# Patient Record
Sex: Male | Born: 1945 | Race: White | Hispanic: No | Smoking: Current every day smoker
Health system: Southern US, Community
[De-identification: ages and names within clinical notes are randomized; demographics above are authoritative.]

## PROBLEM LIST (undated history)

## (undated) ENCOUNTER — Emergency Department (HOSPITAL_COMMUNITY): Admission: EM | Payer: Medicare HMO | Source: Home / Self Care

## (undated) DIAGNOSIS — I739 Peripheral vascular disease, unspecified: Secondary | ICD-10-CM

## (undated) DIAGNOSIS — K449 Diaphragmatic hernia without obstruction or gangrene: Secondary | ICD-10-CM

## (undated) DIAGNOSIS — I10 Essential (primary) hypertension: Secondary | ICD-10-CM

## (undated) HISTORY — PX: FEMORAL-FEMORAL BYPASS GRAFT: SHX936

## (undated) HISTORY — DX: Peripheral vascular disease, unspecified: I73.9

## (undated) HISTORY — PX: FEMORAL BYPASS: SHX50

## (undated) HISTORY — PX: SHOULDER SURGERY: SHX246

## (undated) HISTORY — DX: Essential (primary) hypertension: I10

## (undated) HISTORY — PX: ABDOMINAL SURGERY: SHX537

---

## 2000-09-11 ENCOUNTER — Ambulatory Visit (HOSPITAL_COMMUNITY): Admission: RE | Admit: 2000-09-11 | Discharge: 2000-09-11 | Payer: Self-pay | Admitting: Endocrinology

## 2000-09-11 ENCOUNTER — Encounter: Payer: Self-pay | Admitting: Endocrinology

## 2001-04-03 ENCOUNTER — Encounter: Payer: Self-pay | Admitting: Endocrinology

## 2001-04-03 ENCOUNTER — Encounter: Admission: RE | Admit: 2001-04-03 | Discharge: 2001-04-03 | Payer: Self-pay | Admitting: Endocrinology

## 2001-07-16 ENCOUNTER — Ambulatory Visit (HOSPITAL_BASED_OUTPATIENT_CLINIC_OR_DEPARTMENT_OTHER): Admission: RE | Admit: 2001-07-16 | Discharge: 2001-07-16 | Payer: Self-pay | Admitting: *Deleted

## 2001-07-16 ENCOUNTER — Encounter (INDEPENDENT_AMBULATORY_CARE_PROVIDER_SITE_OTHER): Payer: Self-pay | Admitting: *Deleted

## 2001-07-29 ENCOUNTER — Encounter: Payer: Self-pay | Admitting: Orthopedic Surgery

## 2001-07-29 ENCOUNTER — Encounter: Admission: RE | Admit: 2001-07-29 | Discharge: 2001-07-29 | Payer: Self-pay | Admitting: Orthopedic Surgery

## 2002-06-11 ENCOUNTER — Inpatient Hospital Stay (HOSPITAL_COMMUNITY): Admission: EM | Admit: 2002-06-11 | Discharge: 2002-06-15 | Payer: Self-pay | Admitting: Emergency Medicine

## 2002-06-11 ENCOUNTER — Encounter: Payer: Self-pay | Admitting: Emergency Medicine

## 2002-06-13 ENCOUNTER — Encounter: Payer: Self-pay | Admitting: Internal Medicine

## 2002-06-15 ENCOUNTER — Encounter: Payer: Self-pay | Admitting: Cardiovascular Disease

## 2002-08-07 ENCOUNTER — Emergency Department (HOSPITAL_COMMUNITY): Admission: EM | Admit: 2002-08-07 | Discharge: 2002-08-08 | Payer: Self-pay | Admitting: Emergency Medicine

## 2002-08-07 ENCOUNTER — Encounter: Payer: Self-pay | Admitting: Emergency Medicine

## 2002-08-08 ENCOUNTER — Encounter: Payer: Self-pay | Admitting: Emergency Medicine

## 2003-10-09 ENCOUNTER — Encounter: Admission: RE | Admit: 2003-10-09 | Discharge: 2003-10-09 | Payer: Self-pay | Admitting: Endocrinology

## 2003-10-19 ENCOUNTER — Emergency Department (HOSPITAL_COMMUNITY): Admission: EM | Admit: 2003-10-19 | Discharge: 2003-10-20 | Payer: Self-pay | Admitting: Emergency Medicine

## 2003-11-13 ENCOUNTER — Inpatient Hospital Stay (HOSPITAL_COMMUNITY): Admission: EM | Admit: 2003-11-13 | Discharge: 2003-11-15 | Payer: Self-pay | Admitting: Internal Medicine

## 2003-11-18 ENCOUNTER — Ambulatory Visit (HOSPITAL_COMMUNITY): Admission: RE | Admit: 2003-11-18 | Discharge: 2003-11-18 | Payer: Self-pay | Admitting: Internal Medicine

## 2004-05-15 ENCOUNTER — Emergency Department (HOSPITAL_COMMUNITY): Admission: EM | Admit: 2004-05-15 | Discharge: 2004-05-15 | Payer: Self-pay | Admitting: Emergency Medicine

## 2004-06-03 ENCOUNTER — Ambulatory Visit (HOSPITAL_COMMUNITY): Admission: RE | Admit: 2004-06-03 | Discharge: 2004-06-03 | Payer: Self-pay | Admitting: *Deleted

## 2004-12-22 ENCOUNTER — Emergency Department (HOSPITAL_COMMUNITY): Admission: EM | Admit: 2004-12-22 | Discharge: 2004-12-22 | Payer: Self-pay | Admitting: Emergency Medicine

## 2005-01-13 ENCOUNTER — Ambulatory Visit (HOSPITAL_COMMUNITY): Admission: RE | Admit: 2005-01-13 | Discharge: 2005-01-13 | Payer: Self-pay | Admitting: *Deleted

## 2005-01-24 ENCOUNTER — Ambulatory Visit (HOSPITAL_COMMUNITY): Admission: RE | Admit: 2005-01-24 | Discharge: 2005-01-24 | Payer: Self-pay | Admitting: Cardiovascular Disease

## 2005-01-26 ENCOUNTER — Ambulatory Visit (HOSPITAL_COMMUNITY): Admission: RE | Admit: 2005-01-26 | Discharge: 2005-01-26 | Payer: Self-pay | Admitting: Cardiovascular Disease

## 2005-11-22 ENCOUNTER — Encounter: Admission: RE | Admit: 2005-11-22 | Discharge: 2005-11-22 | Payer: Self-pay | Admitting: Occupational Medicine

## 2006-03-16 ENCOUNTER — Ambulatory Visit (HOSPITAL_COMMUNITY): Admission: RE | Admit: 2006-03-16 | Discharge: 2006-03-16 | Payer: Self-pay | Admitting: *Deleted

## 2006-03-26 ENCOUNTER — Ambulatory Visit (HOSPITAL_COMMUNITY): Admission: RE | Admit: 2006-03-26 | Discharge: 2006-03-26 | Payer: Self-pay | Admitting: Cardiovascular Disease

## 2006-03-27 ENCOUNTER — Ambulatory Visit (HOSPITAL_COMMUNITY): Admission: RE | Admit: 2006-03-27 | Discharge: 2006-03-27 | Payer: Self-pay | Admitting: Cardiovascular Disease

## 2006-04-02 ENCOUNTER — Encounter (INDEPENDENT_AMBULATORY_CARE_PROVIDER_SITE_OTHER): Payer: Self-pay | Admitting: *Deleted

## 2006-04-02 ENCOUNTER — Inpatient Hospital Stay (HOSPITAL_COMMUNITY): Admission: RE | Admit: 2006-04-02 | Discharge: 2006-04-05 | Payer: Self-pay | Admitting: *Deleted

## 2006-04-26 ENCOUNTER — Ambulatory Visit: Payer: Self-pay | Admitting: *Deleted

## 2006-09-13 ENCOUNTER — Ambulatory Visit: Payer: Self-pay | Admitting: *Deleted

## 2006-10-11 ENCOUNTER — Ambulatory Visit: Payer: Self-pay | Admitting: *Deleted

## 2007-09-10 ENCOUNTER — Encounter: Admission: RE | Admit: 2007-09-10 | Discharge: 2007-09-10 | Payer: Self-pay | Admitting: Cardiovascular Disease

## 2007-10-07 ENCOUNTER — Ambulatory Visit: Payer: Self-pay | Admitting: Vascular Surgery

## 2008-01-25 ENCOUNTER — Observation Stay (HOSPITAL_COMMUNITY): Admission: EM | Admit: 2008-01-25 | Discharge: 2008-01-26 | Payer: Self-pay | Admitting: Emergency Medicine

## 2008-04-06 ENCOUNTER — Ambulatory Visit: Payer: Self-pay | Admitting: Vascular Surgery

## 2009-10-01 ENCOUNTER — Encounter: Admission: RE | Admit: 2009-10-01 | Discharge: 2009-10-01 | Payer: Self-pay | Admitting: Cardiovascular Disease

## 2009-10-14 ENCOUNTER — Ambulatory Visit: Payer: Self-pay | Admitting: Vascular Surgery

## 2010-08-02 NOTE — Procedures (Signed)
BYPASS GRAFT EVALUATION   INDICATION:  Followup of lower extremity bypass graft.  Patient has no  new complaints at this time.   HISTORY:  Diabetes:  No.  Cardiac:  No.  Hypertension:  No.  Smoking:  Yes, 1 pack per day.  Previous Surgery:  History of bilateral CIA, PTA and stenting.  Right-to-  left fem-fem BPG on 04/02/2006.   SINGLE LEVEL ARTERIAL EXAM                               RIGHT              LEFT  Brachial:                    138                132  Anterior tibial:             130                130  Posterior tibial:            138                130  Peroneal:  Ankle/brachial index:        1.0                0.94   PREVIOUS ABI:  Date: 04/26/2006  RIGHT:  >1.0  LEFT:  0.92   LOWER EXTREMITY BYPASS GRAFT DUPLEX EXAM:  See attached worksheet for  velocities.   DUPLEX:  Doppler arterial waveforms are triphasic to biphasic proximal  to, within and distal to the right-to-left fem-fem BPG.   IMPRESSION:  1. Stable ABIs bilaterally.  2. Patent right-to-left fem-fem BPG.     ___________________________________________  P. Liliane Bade, M.D.   PB/MEDQ  D:  10/07/2007  T:  10/07/2007  Job:  [id number]

## 2010-08-02 NOTE — Procedures (Signed)
BYPASS GRAFT EVALUATION   INDICATION:  Status post right-to-left femoral-femoral bypass graft.   HISTORY:  Diabetes:  No.  Cardiac:  No.  Hypertension:  No.  Smoking:  Yes.  Previous Surgery:  Right-to-left femoral-to-femoral bypass graft.   SINGLE LEVEL ARTERIAL EXAM                               RIGHT              LEFT  Brachial:                    127                136  Anterior tibial:             139                118  Posterior tibial:            130                116  Peroneal:  Ankle/brachial index:        >1                 0.87   PREVIOUS ABI:  Date: April 26, 2006  RIGHT:  >1  LEFT:  0.92   LOWER EXTREMITY BYPASS GRAFT DUPLEX EXAM:  See attached sheet for  velocities.   DUPLEX:  Evidence of triphasic arterial wave forms proximal to, within,  and distal to the right-to-left femoral-femoral bypass graft.   IMPRESSION:  1. Patent right-to-left femoral-femoral bypass graft.  2. Right ankle-brachial index appears within normal limits with      triphasic flow.  3. Left ankle-brachial index appears stable with mild arterial      compromise and triphasic flow.   ___________________________________________  P. Liliane Bade, M.D.   AS/MEDQ  D:  10/11/2006  T:  10/12/2006  Job:  [id number]

## 2010-08-02 NOTE — Procedures (Signed)
BYPASS GRAFT EVALUATION   INDICATION:  Follow up fem-fem bypass graft.   HISTORY:  Diabetes:  No.  Cardiac:  No.  Hypertension:  No.  Smoking:  Yes.  Previous Surgery:  History of bilateral common iliac artery stents,  right-to-left fem-fem bypass graft on 04/02/06.   SINGLE LEVEL ARTERIAL EXAM                               RIGHT              LEFT  Brachial:                    119                131  Anterior tibial:             122                120  Posterior tibial:            133                135  Peroneal:  Ankle/brachial index:        1.02               1.03   PREVIOUS ABI:  Date: 10/07/07  RIGHT:  1.0  LEFT:  0.94   LOWER EXTREMITY BYPASS GRAFT DUPLEX EXAM:   DUPLEX:  1. Triphasic Doppler waveforms noted throughout the bypass graft and      its native vessels with no focal narrowing noted.  2. Stable increased velocity of 273 cm/s noted at the proximal      anastomosis; however, this is most likely due to the angle of graft      take-off.   IMPRESSION:  1. Patent right-to-left femoral-femoral bypass graft with increased      velocity noted, as described above.  2. Stable bilateral ankle brachial indices.   ___________________________________________  P. Liliane Bade, M.D.   CH/MEDQ  D:  04/06/2008  T:  04/06/2008  Job:  [id number]

## 2010-08-02 NOTE — Assessment & Plan Note (Signed)
OFFICE VISIT   Jeremiah Hansen, Jeremiah Hansen  DOB:  Aug 02, 1945                                       09/13/2006  WUJWJ#:[id number]   The patient is status post right to left femoral-femoral bypass, carried  out April 02, 2006 at [facility].  He has had a couple of  episodes of superficial infection apparently at his incision sites.  Called the office earlier this week complaining of some redness around  his incision site with some drainage.  He was placed on Keflex at that  time.  On evaluation at this time he has some jock itch in his groin  area.  There is no evidence of graft infection.  The graft is patent.  Blood pressure is 124/78, pulse 71 per minute regular, respirations 18  per minute.  Temperature 98.   The patient instructed to complete his course of Keflex, cleanse his  groin area with antibacterial soap daily and dry well with a hair dryer.  Apply Nystatin powder and place on oral fluconazole for 10 days.  Return  p.r.n.   Balinda Quails, M.D.  Electronically Signed   PGH/MEDQ  D:  09/13/2006  T:  09/14/2006  Job:  [id number]

## 2010-08-05 NOTE — Op Note (Signed)
NAME:  Jeremiah Hansen, Jeremiah Hansen NO.:  [id number]   MEDICAL RECORD NO.:  [id number]          PATIENT TYPE:  AMB   LOCATION:  SDS                          FACILITY:  [facility]   PHYSICIAN:  Balinda Quails, M.D.    DATE OF BIRTH:  1945/09/23   DATE OF PROCEDURE:  01/13/2005  DATE OF DISCHARGE:                                 OPERATIVE REPORT   PHYSICIAN:  Denman George, MD.   DIAGNOSIS:  Recurrent left lower extremity claudication.   PROCEDURE:  1.  Abdominal aortogram with pelvic runoff arteriography.  2.  Left lower extremity arteriogram.  3.  Bilateral common iliac artery kissing balloon angioplasty .   ACCESS:  Bilateral common femoral artery 6-French sheath, right, 7-French  sheath, left.   CONTRAST:  125 mL of Visipaque.   COMPLICATIONS:  None apparent.   CLINICAL NOTE:  Jeremiah Hansen is a 65 year old male with history of  heavy tobacco use.  He has previously undergone bilateral common iliac  artery kissing balloon stent deployment due to bifurcation plaque disease  and bilateral claudication.  This was carried out approximately 6 months  ago.  He presented with recurrent claudication symptoms in his left lower  extremity.  Doppler evaluation was consistent with a recurrent left iliac  occlusive disease.  He is brought to the cath lab at this time for  diagnostic arteriography, and planned further intervention as indicated.  Risks and benefits of this procedure were explained to the patient in  detail.  Major morbidity and mortality 1% to 2%.   PROCEDURE NOTE:  The patient was brought to the cath lab in stable  condition.  Placed in supine position.  Both groins were prepped and draped  in a sterile fashion.  Administered 50 mcg of fentanyl intravenously.  Skin  and subcutaneous tissue in the left groin were instilled with 1% Xylocaine.  The needle was easily introduced into the left common femoral artery. A  0.035 J-wire passed through the needle into  the mid abdominal aorta.  The  needle removed and a 6-French sheath advanced over the guidewire.  The  dilator removed and the sheath flushed with heparin and saline solution.  A  standard pigtail catheter was then advanced up over the guidewire to the  juxta-renal aorta.   AP abdominal aortogram obtained.  This revealed single widely patent renal  arteries bilaterally.  Brisk flow was noted through the superior mesenteric  artery.  The infrarenal aorta was widely patent.  At the aortic bifurcation,  there was an eccentric plaque jutting into the left common iliac artery  origin. This resulted in approximately a 70% to 80% stenosis of the origin  of left common iliac artery.  Bilateral common iliac stents were in place in  a kissing configuration.  The right common iliac artery revealed mild in-  stent restenosis.  The remainder of the common iliac, external iliac and  hypogastric arteries were widely patent bilaterally.   Through the left femoral sheath, a retrograde injection was made to obtain  left lower extremity runoff arteriography.  This revealed the left external  iliac artery to be widely patent.  The left profunda femoris was normal.  The left superficial femoral artery revealed mild plaque disease without  significant stenosis.  The left popliteal artery was widely patent.  The  runoff to the left foot was intact with three-vessel tibial artery runoff.   An exchange was then made in the left groin for a long 7-French sheath.   The right groin was accessed with a 6-French sheath.  Skin and subcutaneous  tissue were instilled with 1% Xylocaine.  A needle was easily introduced in  the right common femoral artery.  A 0.035 J-wire passed through the needle  into the mid-abdominal aorta.   The patient was then administered 5000 units of heparin intravenously.  Initial ACT measured 168 seconds.  The patient received an additional 2000  units of heparin intravenously, repeat ACT  211 seconds.   A long 7-French sheath was then advanced over the guidewire across the left  common iliac stenosis.  A 2 x 2 cutting balloon was then advanced over the  guidewire.  This was inflated within the left common iliac stent with 4  separate inflations at 8 atmospheres x15 seconds with rotation of the  balloon.  The cutting balloon was then removed.  Bilateral 8 x 2 Powerflex  balloons were then advanced over the guidewires and placed within the  kissing stents and deployed at 8 atmospheres x20 seconds.  Readjusted and 2  further deployments at 10 atmospheres x30 seconds.  Following this, the  balloons were removed.  Retrograde injection then made through the left  femoral sheath.  This revealed excellent technical result.  There was brisk  flow noted through the left common iliac artery with minimal residual  stenosis.   The patient tolerated the procedure well.  There were no apparent  complications.  Repeat ACT was 200 seconds.  Total contrast -- 125 mL of  Visipaque.  The patient did receive an additional 50 mcg of fentanyl during  the procedure.   Bilateral sheaths were then removed.  There were no apparent complications.   FINAL IMPRESSION:  1.  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  2.  Successful redilatation of in-stent restenosis, left common iliac      artery, using kissing balloon bilateral common iliac technique.      Balinda Quails, M.D.  Electronically Signed     PGH/MEDQ  D:  01/13/2005  T:  01/13/2005  Job:  [id number]

## 2010-08-05 NOTE — Op Note (Signed)
NAME:  Jeremiah Hansen, Jeremiah Hansen NO.:  [id number]   MEDICAL RECORD NO.:  [id number]          PATIENT TYPE:  AMB   LOCATION:  SDS                          FACILITY:  [facility]   PHYSICIAN:  Balinda Quails, M.D.    DATE OF BIRTH:  Jan 16, 1946   DATE OF PROCEDURE:  03/16/2006  DATE OF DISCHARGE:                               OPERATIVE REPORT   DIAGNOSIS:  Left lower extremity claudication.   PROCEDURE:  Abdominal aortogram with bilateral lower extremity runoff  arteriography.   ACCESS:  Right common femoral artery 5-French sheath.   CONTRAST:  130 miles Visipaque.   COMPLICATIONS:  None apparent.   CLINICAL NOTE:  [name] is a 65 year old male with a history  of tobacco abuse.  He has previously undergone bilateral common iliac  stenting in March of 2006 followed by redo angioplasty for recurrent  stenosis October 2006.  He has developed recurrent symptoms of  claudication in his left lower extremity.  Doppler evaluation consistent  with left iliac occlusion.  Brought to the cath lab at this time for  diagnostic arteriography and possible reintervention.   PROCEDURE NOTE:  The patient brought to the cath lab in stable  condition.  Placed in the supine position.  Both groins prepped and  draped in a sterile fashion.  Administered 2 mg of Versed, 50 mcg of  Fentanyl intravenously.  Skin and subcutaneous tissue of right groin  instilled with 1% Xylocaine.  Access right common femoral artery  obtained with an 18 gauge needle.  A 0.035 J-wire passed through the  needle into the mid abdominal aorta.  A 5-French sheath advanced over  the guidewire.   A pigtail catheter advanced over the guidewire to the mid abdominal  aorta.  Standard AP mid abdominal aortogram obtained.  This revealed  widely patent single bilateral renal arteries.  The infrarenal aorta was  patent.  The right common iliac artery stent was widely patent.  The  left common iliac artery stent was  occluded at the origin of the left  common iliac artery.  The pelvic arteriogram revealed patency of the  right common iliac, external and hypogastric arteries.  On the left side  the proximal common iliac artery was occluded, there was collateral flow  reconstituting the hypogastric and left external iliac arteries.   Lower extremity runoff arteriography obtained.  This verified patency of  the common femoral, profunda femoris, superficial femoral, popliteal and  three-vessel tibial artery runoff bilaterally.   This completed the arteriogram procedure.  Guidewire reinserted.  Pigtail catheter removed.  Right femoral sheath removed.  No apparent  complications.   FINAL IMPRESSION:  1. Single widely patent bilateral renal arteries.  2. Occlusion of the left common iliac artery stent.  3. Widely patent right common iliac artery stent.  4. Intact infrainguinal runoff bilaterally.   DISPOSITION:  These results have been reviewed with the patient.  He  will require right-to-left femoral-femoral bypass for relief of  claudication symptoms.      Balinda Quails, M.D.  Electronically Signed     PGH/MEDQ  D:  03/16/2006  T:  03/16/2006  Job:  [id number]   cc:   Gregary Signs A. Everardo All, MD  Ricki Rodriguez, M.D.

## 2010-08-05 NOTE — Op Note (Signed)
NAME:  [name], [name] NO.:  [id number]   MEDICAL RECORD NO.:  [id number]          PATIENT TYPE:  INP   LOCATION:  3303                         FACILITY:  [facility]   PHYSICIAN:  Balinda Quails, M.D.    DATE OF BIRTH:  02/09/1946   DATE OF PROCEDURE:  04/02/2006  DATE OF DISCHARGE:                               OPERATIVE REPORT   SURGEON:  P Bud Face, MD   ASSISTANT:  Gershon Crane, PA.   ANESTHETIC:  General endotracheal.   ANESTHESIOLOGIST:  Dr. Katrinka Blazing.   PREOPERATIVE DIAGNOSIS:  Left lower extremity claudication.   POSTOPERATIVE DIAGNOSIS:  Left lower extremity claudication.   PROCEDURE:  Right-to-left femoral-femoral bypass with 7-mm Dacron  Hemashield graft.  Left lower extremity claudication.   CLINICAL NOTE:  Jeremiah Hansen is a 65 year old male with history of  known peripheral vascular disease.  He has continued to use tobacco.  He  previously underwent a bilateral common iliac artery kissing stent  procedure.  Initially following this, he had complete relief of  claudication.  He represented with left lower extremity claudication  symptoms.  Workup for this revealed occlusion of left common iliac stent  at its origin.  He is brought to the operating at this time for right-to-  left femoral-femoral bypass.   PROCEDURE NOTE:  The patient brought to the operating room stable  condition.  Placed in supine position.  General endotracheal anesthesia  induced.  Both legs prepped and draped in sterile fashion.   Matching longitudinal skin incisions were made from the inguinal  ligament bilaterally.  Dissection carried down through subcutaneous  tissue with electrocautery.  Lymphatics divided.  Bridging veins ligated  with 2-0 silk and divided.  The common femoral artery exposed at the  inguinal ligament bilaterally.  This was freed and encircled with vessel  loop.  Distal dissection carried down the origin of the profunda and  superficial femoral  arteries which were encircled with vessel loops  bilaterally.  Small tributaries were ligated with 3-0 silk.  Larger  tributaries controlled with looped 2-0 silk suture.   Evaluation of the right groin revealed an excellent pulse.  There was  moderate thickening of the common femoral artery.  Examination of the  left groin revealed absent palpable pulse.  The common femoral artery  was patent, moderately thickened.  The profunda and superficial femoral  artery at the origins were also widely patent.   A suprapubic tunnel was created between the two incisions and a 7 mm  Hemashield Dacron graft placed through the tunnel.   The patient administered 5000 units heparin intravenously.  Adequate  circulation time permitted.  Right femoral vessels controlled with  clamps.  Longitudinal arteriotomy made in the right common femoral  artery.  The right side of the graft was beveled and anastomosed end-to-  side to the right common femoral artery using running 5-0 Prolene  suture.  At completion of the femoral anastomosis the vessels were  flushed through the graft and the graft controlled with a fistula clamp.   In the left groin, the femoral vessels controlled with clamps.  Longitudinal  arteriotomy made.  This was a moderately diseased vessel,  common femoral artery patent.  The left side of the graft was beveled  and anastomosed end-to-side to common femoral artery using running 5-0  Prolene suture.  At completion of the graft the vessels were flushed.  Clamps were then removed.  Excellent flow present through the graft.  Adequate hemostasis obtained.  Sponge and instrument counts correct.   The wound was irrigated with saline solution.  Subcutaneous tissue  closed in two layers of running 2-0 Vicryl suture.  Skin closed with 4-0  Monocryl.  Steri-Strips applied.   Sterile dressing applied.  No apparent complications.  Transferred  recovery room in stable condition.      Balinda Quails,  M.D.  Electronically Signed     PGH/MEDQ  D:  04/02/2006  T:  04/03/2006  Job:  [id number]   cc:   Eduardo Osier. Sharyn Lull, M.D.

## 2010-08-05 NOTE — Discharge Summary (Signed)
NAME:  Jeremiah Hansen, Jeremiah Hansen NO.:  [id number]   MEDICAL RECORD NO.:  [id number]                   PATIENT TYPE:  INP   LOCATION:  0367                                 [facility]ACILITY:  [facility]   PHYSICIAN:  Titus Dubin. Alwyn Ren, M.D. [facility]         DATE OF BIRTH:  Dec 24, 1945   DATE OF ADMISSION:  11/13/2003  DATE OF DISCHARGE:  11/15/2003                                 DISCHARGE SUMMARY   ADMISSION DIAGNOSES:  1. Epigastric pain.  2. Thoracic pains.  3. Recent prostatitis.   DISCHARGE DIAGNOSES:  1. Epigastric pain.  2. Thoracic pain.  3. Recent prostatitis.  4. Abnormal CAT scan of the lungs with suggestion of mediastinal and hilar     lymphadenopathy with micronodular opacities in the upper lobes suggesting     infectious or inflammatory process.  Findings relatively stable since Aug 08, 2003.  5. Gallstones without evidence of acute cholecystitis.   HISTORY:  Mr. Jeremiah Hansen is a 65 year old white male with pleuritic pain in his  scapular areas for two days.  He also has had epigastric pain for two years  intermittently.   He had previously been evaluated by a gastroenterologist on [address];  he is unable to remember the name of the gastroenterologist.   He was admitted to observation with cardiac enzymes and telemetry  monitoring.  Cardiac enzymes were negative for myocardial injury.  EKG was  normal.  The CPK ranged from 68 to 78, with normal troponin-I of 0.02 or  less x3.  A D-dimer was also normal at 0.33.  White blood cell count was  mildly elevated at 10,700;  hematocrit was 41.4.  Differential was normal.  CMET was completely normal.  Specifically, liver function tests reveal an  AST of 21, ALT of 22.  Lipase was normal at 39.  Urine revealed trace  leukocytes with 0 to 2 white cells.  Urine culture is pending.   On November 14, 2003, he stated that the pleuritic pain was no longer present,  but he was continuing to have some epigastric and left  upper quadrant pain  which had been a problem for at least a year or more.  Gallstones had been  diagnosed over a year ago during an evaluation for abdominal pain.   He believes his colonoscopy was performed in 2002.  He states that a 10 mm  polyp was found and he was due for repeat study, but has not.   Because of the pleuritic chest pain and negative cardiac enzymes, CAT scan  was performed.  This showed relative stability compared to Aug 08, 2002, of  bulky mediastinal and hilar lymphadenopathy with micronodular opacities in  both upper lobes.  Gallstones again were noted without acute cholecystitis.   At the time of discharge, he has normal epigastric tenderness, but is not  having epigastric pain and the thoracic pain has resolved.   At the time of discharge,  an ACE level is pending because of the CT scan  findings.   Gastroenterologic consultation will be pursued as an outpatient to be  arranged by Dr. Everardo All to evaluate the persistent epigastric discomfort.  An additional question would be should an elective cholecystectomy be  performed because of the recurrent nature of the epigastric pain.   Pulmonary consultation would be recommended because of the hilar and  mediastinal lymphadenopathy and the reticular nodular changes on CAT scan.  He may be a candidate for transbronchial biopsy.  Another consideration  would be mediastinoscopy, although the findings are stable based on the x-  ray report.   The discharge summary was dictated in front of Mr. Dennard to facilitate  continuity of care and verify that he will follow up.  He states that he  would prefer to see one of the gastroenterologist at [facility] practice.  He  is concerned because of the chronicity of the problems and wishes to bring  some finality or closure to this clinical picture.   CONDITION ON DISCHARGE:  Improved.   PROGNOSIS:  Depended upon the evaluations of both gastroenterologic and  pulmonary as noted  above.   He will continue a proton pump inhibitor.  It will be recommended that he  avoid those triggers for hyperacidity and reflux such as the aspirin family,  alcohol, peppermint, tobacco, and caffeine.  Protonix  40 mg daily will be prescribed rather than the [facility] products until he is  evaluated by gastroenterology.  He has been taking aspirin.  It will be  recommended that he take a coated baby aspirin with breakfast or lunch.  He  will continue his statin.   He is to return to the emergency room should he have recurrence of the chest  or abdominal pain.   Additionally, he will continue his Septra DS he has been taking for his  prostatitis.   DIET:  As tolerated.   The specific restrictions in reference to the reflux and hyperacidity are  mentioned above.  He should avoid oral intake within two hours of going to  bed.  Again, pending at the time of discharge is an ACE level.                                               Titus Dubin. Alwyn Ren, M.D. [facility]    [facility]/MEDQ  D:  11/15/2003  T:  11/16/2003  Job:  [id number]   cc:   Gregary Signs A. Everardo All, M.D. [facility]

## 2010-08-05 NOTE — Discharge Summary (Signed)
NAME:  Jeremiah Hansen, Jeremiah Hansen NO.:  [id number]   MEDICAL RECORD NO.:  [id number]          PATIENT TYPE:  OIB   LOCATION:  2899                         FACILITY:  [facility]   PHYSICIAN:  Constance Holster, PA    DATE OF BIRTH:  [birth year]   DATE OF ADMISSION:  03/27/2006  DATE OF DISCHARGE:  03/27/2006                               DISCHARGE SUMMARY   DICTATED FOR:  Liliane Bade, M.D.   ADMISSION DIAGNOSES:  1. Left iliac occlusion disease.  2. Suspicious mole on back.   DISCHARGE DIAGNOSES:  1. Left iliac occlusive disease, status post right to left FFBG.  2. Suspicious mole, status post removal.  3. Hypertension.  4. Hyperlipidemia.  5. Gastroesophageal reflux disease.  6. Hiatal hernia.  7. Extracranial cerebrovascular occlusive disease.  8. Coronary artery disease.  9. History of tobacco abuse.   PROCEDURES:  April 02, 2006 - The patient had a right to left femoro-  femoral bypass graft with a 7-mm Hemashield and removal of mole with  biopsy.   HISTORY AND PHYSICAL:  This is a 65 year old Caucasian male, past  medical history of tobacco abuse and coronary artery disease.  The  patient has been complaining of left lower extremity claudication.  He  has previously undergone bilateral iliac angioplasty with stenting in  March 2006, followed by a redo angioplasty in October 2006.  The patient  did initially obtain significant relief from his claudication symptoms;  however, now he has recurrent symptoms in the left lower extremity for  approximately 2 months.  The patient's Doppler evaluation verifies a  significant drop off in his left lower extremity, ABI at 0.55.  The  right ABI remains greater than 1.  His previous left ABI following his  most recent intervention was greater than 1.  He did have a PTA and  stent bilaterally iliacs in March and October of 2006.  Just thought the  patient undergo a right to left fem-fem bypass graft for relief of his  claudication  symptoms.  The patient agreed to proceed.  See dictated  History and Physical for further details.   HOSPITAL COURSE:  The patient underwent a fem-fem bypass graft and  removal of a mole on his back April 02, 2006, without any  complications.  Postoperatively the patient was a bit slower to  ambulate.  He did not ambulate on postop day #1, and he was encouraged  to ambulate on postop day #2.  Postop day #3, the patient was feeling  better and ambulating without difficulty.  The patient did have a PCA  postoperatively for pain control.  This was discontinued on postop day  #3, and he switched to p.o. pain medication.  The patient had some  leukocytosis postoperatively with a white blood cell count of 16.4 on  postop day #1; however, did not have any signs or symptoms of infection  and this trended downward.  Postop day #3, white blood cell count is  12.6.   Postoperatively, the patient has been hemodynamically stable.  Hematocrit 13.1, hemoglobin 38.7.  Renal function has been intact with  BUN  of 4, creatinine of 0.8.  The patient has 2+ bilateral dorsalis  pedis pulses in his extremities.  His incisions are clear, dry and  intact and healing well.  The patient did have some nausea postop day  #1.  He was given Zofran with relief.  The patient is now tolerating his  diet well.  The patient had ABIs on April 04, 2006, which are 1.03 on  the right and 0.97 on the left, has since improved bilaterally.   DISCHARGE DISPOSITION:  The patient will be discharged home in good  health in the next 1-2 days provided he is ambulating without  difficulty, and his pain is controlled on p.o. medication.   MEDICATIONS:  1. Percocet 5/325 mg 1-2 tabs p.o. every 4 hours p.r.n.  2. Norvasc 2.5 mg p.o. daily.  3. Plavix 75 mg p.o. daily.  4. Lipitor 80 mg p.o. daily.  5. Aspirin 81 mg p.o. daily.  6. Protonix 40 mg p.o. daily.   INSTRUCTIONS:  The patient instructed to follow a low-fat, low-salt   diet.  No driving or heavy lifting greater than 10 pounds.  He is  encouraged to ambulate 3-4 times daily and increase activity as  tolerated.  He is to continue his breathing exercises.  He may shower  and clean his incisions with mild soap and water.  He is to call our  office if any one problem should arise such as incision erythema,  drainage, temp greater than 101.5.   FOLLOWUP:  The patient will have follow up appointment with Dr. Madilyn Fireman on  April 26, 2006, at 11:30 a.m.      Constance Holster, PA     JMW/MEDQ  D:  04/05/2006  T:  04/05/2006  Job:  [id number]

## 2010-08-05 NOTE — H&P (Signed)
NAME:  Jeremiah Hansen, Jeremiah Hansen NO.:  [id number]   MEDICAL RECORD NO.:  [id number]           PATIENT TYPE:   LOCATION:                               FACILITY:  [facility]   PHYSICIAN:  Balinda Quails, M.D.    DATE OF BIRTH:  11-15-1945   DATE OF ADMISSION:  04/02/2006  DATE OF DISCHARGE:                              HISTORY & PHYSICAL   CHIEF COMPLAINT:  Left lower extremity claudication.   HISTORY OF PRESENT ILLNESS:  Patient is a 65 year old white male with  recurrent left lower extremity increasing claudication symptoms.  He has  known iliac artery disease, status post previous bilateral iliac artery  stenting and PTA in March and October of 2006.  Most recently, he has  developed recurrent symptoms of claudication of his left lower extremity  and Doppler study reveals findings consistent with left iliac occlusion.  Ankle brachial index was 0.55 on the left and greater than 1.0 on the  right.  Arteriogram was performed on March 17, 2006 by Dr. Madilyn Fireman and  the impression included the following:  1.  Single widely patent  bilateral renal arteries.  2.  Occlusion of left common iliac artery  stent.  3.  Widely patent right common iliac artery stent.  4.  Intact  infrainguinal run off bilaterally.  Currently, the patient primarily has  symptoms in the hip and thigh with some occasional rest pain, but  primarily this is related to ambulation.  He does get some swelling in  his lower extremity and the left foot is also cool.  He has had no  significant difficulties with motor function and he denies skin changes  to include ulcerations or evidence of infection.   PAST MEDICAL HISTORY:  1. Peripheral vascular occlusive disease.  2. Hypercholesterolemia.  3. Gastroesophageal reflux/hiatal hernia.  4. History of cranial cerebrovascular occlusive disease.  5. Coronary artery disease.  6. Tobacco abuse.   PAST SURGICAL HISTORY:  Bilateral iliac artery stenting and PTA.   ALLERGIES:  INCLUDE AN INTOLERANCE TO CODEINE, WHICH CAUSES NAUSEA.   CURRENT MEDICATIONS:  1. Protonix 40 mg daily, which he is not taking currently due to cost.  2. Plavix 75 mg daily.  3. Aspirin 162 mg daily.  4. Lipitor 80 mg daily.  5. Norvasc 2.5 mg daily, which was started last Wednesday.   REVIEW OF SYSTEMS:  See the history of the present illness for pertinent  positives and negatives; otherwise, is remarkable for dyspnea on  exertion.   SOCIAL HISTORY:  He is single with 2 children.  He has smoked for  approximately 4 years, 1-2 packs per day.  He has tried to quit in the  past 2 weeks and has decreased significantly, but does smoke  occasionally.  He is anxious to quit completely.  Alcohol use, none.   FAMILY HISTORY:  His mother is deceased at age 55 from cancer and  history of myocardial infarction.  Father deceased at age 26 from heart  disease.  He has 1 sister with diabetes and 1 brother who had a  myocardial infarction.   PHYSICAL EXAMINATION:  VITAL SIGNS:  Blood pressure 130/74.  Heart rate  72.  Respirations 12.  GENERAL APPEARANCE:  [age]-year-old white male in no acute  distress.  HEENT EXAMINATION:  Normocephalic, atraumatic.  Pupils equal, round and  reactive to light.  Extraocular movements intact.  Oral mucosa is pink.  Teeth are in good repair.  Pharynx is clear of exudates or erythema.  Sclerae is anicteric.  NECK EXAMINATION:  Supple.  No jugular venous distention.  No bruits.  No  lymphadenopathy.  PULMONARY EXAMINATION:  Symmetrical on aspiration.  Nonlabored.  Clear  breath sounds without wheezes, rhonchi or crackle.  CARDIAC EXAMINATION:  Regular rate and rhythm.  No murmurs, gallops or  rubs.  ABDOMINAL EXAMINATION:  Soft, nontender, nondistended.  Normoactive  bowel sounds.  No masses.  No bruits.  GENITOURINARY/RECTAL:  Deferred.  EXTREMITY EXAMINATION:  Peripheral pulses absent.  Dorsalis pedis and  posterior tibial on the left,  present on right.  SKIN EXAMINATION:  He does have a small mole on the right lower portion  of his back.  It has an irregular border and is known to have increase  in appearance becoming more dark lately, noted by his girlfriend.  NEUROLOGICAL EXAMINATION:  Nonfocal.  Alert and oriented x3.  Muscle  strength is 5+ and equal bilaterally.  Findings are symmetric.   ASSESSMENT:  Severe iliac artery occlusive disease, left side.   PLAN:  Right to left femoral femoral bypass.  Also plans for biopsy of  the mole on the right lower portion of the back.      Rowe Clack, P.A.-C.      Balinda Quails, M.D.  Electronically Signed    WEG/MEDQ  D:  03/29/2006  T:  03/30/2006  Job:  [id number]   cc:   Gregary Signs A. Everardo All, MD  Ricki Rodriguez, M.D.

## 2010-08-05 NOTE — Discharge Summary (Signed)
NAME:  Jeremiah Hansen, Jeremiah Hansen NO.:  [id number]   MEDICAL RECORD NO.:  [id number]                   PATIENT TYPE:  INP   LOCATION:  3732                                 FACILITY:  [facility]   PHYSICIAN:  Titus Dubin. Alwyn Ren, M.D. [facility]         DATE OF BIRTH:  March 18, 1946   DATE OF ADMISSION:  06/11/2002  DATE OF DISCHARGE:  06/15/2002                                 DISCHARGE SUMMARY   ADMISSION DIAGNOSES:  1. Unstable angina pectoris, rule out myocardial infarction.  2. 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  3. History of right brain stroke.  4. Continuing tobacco use.  5. Hypercholesterolemia.   DISCHARGE DIAGNOSES:  1. Chest pain, questionable etiology.  2. 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  3. History of right brain stroke.  4. Continuing tobacco use.  5. Hypercholesterolemia.  6. Abnormal CT scan of lung with questionable hiatal lymphadenopathy, 9 mm     nodule in left lower lobe with chronic bronchitic changes.   BRIEF HISTORY:  The patient is a 65 year old gentleman who developed severe  midsternal chest pain the day of admission radiating to the neck, shoulder,  and down the left arm. It was aggravated by deep inspiration.  He also had  some dyspepsia-associated symptoms.  Sublingual nitroglycerin promptly  relieved the chest pain.   Subsequently he made the comment that his green arm band was suspiciously  changed to a white one after his date of birth was asked by the nurse.  He  questioned whether he had received medications which could have aggravated  the chest pain as he was unable to complete the admission portion of the  stress test.   Significantly, he states he has worked in [employer] all over the world and  in the [place] for over 20 years at the [employer].  Additionally, he  has smoked from age 44 to 79, quitting two weeks ago.  He states that he had  normal CT scan of  the lung in January 2003 at [facility].  I made an  attempt by phone to contact [facility] on March 27 but was unable to  receive contact with them but received a recorded advertising message.  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  I gave an  option of having a pulmonologist see him while he was here in the hospital,  but he declined to do that.  He says he plans to leave is his stress  Cardiolite is normal and see Dr. Everardo All March 29 in the office.  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   His serial CPKs were normal as were troponin's.  Basic metabolic profile was  normal.  White count was initially elevated at 13,900.  Lipid profile was  surprisingly good with a total cholesterol of 164, triglycerides 101, HDL  42, and LDL 102.  He exhibited no anemia.  He was anticoagulated with  heparin.  Glucose was initially elevated at 139, but on repeat was 109.  He  actually had five different CPKs and MBs which were within normal limits.   His basic chest x-ray showed slightly increased chronic markings but no  active disease.  As noted, he had no abnormalities on CT scan.   EKG was read as normal sinus rhythm with normal findings.   On the day of potential discharge, he was pain free.   He had exhibited intermittent frustration and anger over various matters.  He was transferred from 5500 to 3700 because of disturbances by his  roommate.  When I interviewed him and examined him on March 27, he was very  upset that I did not have the office records concerning previous CT scan.  He was placed on incentive spirometry because of questionable atelectasis.  There was the question of pneumonia as part of the differential on the CT  scan, but he was afebrile, and his white count had dropped to normal at  10,000.   At time of discharge, there will be no change in Mevacor or  aspirin.  He did  make comment that the Vicodin relieved his chest pain in the hospital.   Further cardiac workup to include possible catheterization will be up to  discussion with Dr. Algie Coffer.   As he refused pulmonary consultation in the hospital, I recommended that he  obtain the old CT scans for direct comparison with the new with further  evaluation based on this comparison.  He was concerned that the 9 mm nodule  might be a cancer.  I told him his options would be resection versus  radiographic monitoring.  Certainly if this lesion was not on the January  2003 film, then resection would be appropriate.  I told him that a 9 mm  nodule was not causing his chest pain.  He did have some hilar mediastinal  lymphadenopathy on CT scan.  Based on further evaluation, mediastinoscopy  might be indicated.   At this time, he was afebrile with blood pressure 110/70.  Pulse was 58 and  regular, respiratory rate 20 to 22 and unlabored.  Chest was clear.  I could  not appreciate lymphadenopathy.   In view of peripheral vascular occlusive disease and prior right brain  stroke, he must be considered as having cardiovascular disease.  Smoking  cessation will be critical.  At the time of admission, Dr. Amador Cunas  states the patient was smoking one pack per day.  But, as noted, he told me  he quit two weeks prior to admission.   The patient does understand that the radiographic changes will need followup  and that he has refused this a this time.   A copy of this will be sent to Dr Everardo All and Dr. Algie Coffer to facilitate  continuity care.                                               Titus Dubin. Alwyn Ren, M.D. [facility]    WFH/MEDQ  D:  06/15/2002  T:  06/16/2002  Job:  [id number]   cc:   Gregary Signs A. Everardo All, M.D. [facility]   Ricki Rodriguez, M.D.  [address]  [address]  [place] [address]

## 2010-08-05 NOTE — Op Note (Signed)
NAME:  Jeremiah Hansen, Jeremiah Hansen NO.:  [id number]   MEDICAL RECORD NO.:  [id number]          PATIENT TYPE:  OIB   LOCATION:  2899                         FACILITY:  [facility]   PHYSICIAN:  Balinda Quails, M.D.    DATE OF BIRTH:  December 14, 1945   DATE OF PROCEDURE:  06/03/2004  DATE OF DISCHARGE:                                 OPERATIVE REPORT   PHYSICIAN:  P Bud Face, MD   DIAGNOSIS:  Right lower extremity claudication.   PROCEDURE:  1.  Abdominal aortogram with bilateral lower extremity runoff arteriography.  2.  Bilateral common iliac artery percutaneous transluminal angioplasty and      stenting.  3.  AngioSeal closure of bilateral common femoral arteries.   ACCESS:  Bilateral common femoral artery 7-French sheaths.   CONTRAST:  240 mL of Visipaque.   COMPLICATIONS:  None.   CLINICAL NOTE:  Mr. Morad Tal is a 65 year old male with history of  fairly heavy tobacco abuse.  He was seen in the office with right lower  extremity claudication symptoms.  Physical examination and Doppler  evaluation reveals evidence of aortoiliac occlusive disease.  He is brought  to the operating at this time for planned diagnostic arteriography and  possible intervention.  Risks of the procedure were explained to the patient  in detail with a major morbidity and mortality of 1% to 2% to include, but  not limited to, vessel occlusion, bleeding and emergency surgery.   PROCEDURE NOTE:  The patient was brought to the cath lab in stable  condition.  Placed in supine position.  Both groins were prepped and draped  in sterile fashion.  Administered 50 mcg of fentanyl intravenously, 2 mg of  Versed intravenously.   Skin and subcutaneous tissue in the right groin was instilled with 1%  Xylocaine.  A needle was easily introduced into the right common femoral  artery.  A 0.025 Magic Torque guidewire was advanced through the needle in  the mid abdominal aorta.  The needle removed, 5-French  sheath advanced over  the guidewire, the dilator remove and the sheath flushed with heparin and  saline solution.   The pigtail catheter was then advanced over the guidewire.  Pigtail catheter  advanced through the mid abdominal aorta.  Standard AP mid abdominal  aortogram obtained.  This revealed widely patent bilateral single renal  arteries.  Brisk flow noted through the superior mesenteric artery.  The  infrarenal aorta was normal in caliber.  At the aortic bifurcation, there  was a complex plaque.  This plaque involve the origin of the common iliac  arteries bilaterally.  There was a high-grade 80% stenosis of the right  common iliac artery just beyond its origin.  There was a complex left  lateral wall aortic plaque impinging on the origin left common iliac artery  with approximately a 50% stenosis.   The pigtail catheter was brought down the aortic bifurcation and bilateral  lower extremity runoff arteriography obtained.  The common iliac arteries  were patent beyond their origin to the level of the origin of the external  iliacs.  The external  iliacs were patent bilaterally.  The common femoral  arteries were widely patent.  The profunda femoris and superficial femoral  arteries were intact bilaterally.  The popliteal arteries were normal  bilaterally.  There was intact three-vessel tibial artery runoff present  bilaterally in the calves.   Bilateral oblique pelvic arteriography was obtained.  This verified a large  left hypogastric artery.  The right hypogastric artery revealed a stenosis  at its origin.  The common iliac arteries bilaterally were normal in caliber  beyond their origin.  There were bilateral complex common iliac artery  origin plaques present with  80% and 50% stenoses in the right and left,  respectively.  The external oblique arteries were patent bilaterally.  The  largest hypogastric artery was on the left and provided the dominant flow to  the pelvis.    Skin and subcutaneous tissue in the left groin was then instilled with 1%  Xylocaine.  The needle easily introduced into the left common femoral  artery. A 0.025 Magic Torque guidewire was advanced through the needle into  the mid abdominal aorta.  A long 7-French sheath was then advanced over the  guidewire. The 7-French sheath was advanced up into the aorta.  The dilator  removed and sheath flushed with heparin and saline solution.  The guidewire  was then reinserted through the pigtail catheter in the right groin, the  pigtail catheter removed.  The6-French sheath was removed and exchanged for  a long 7-French sheath, which was advanced up into the abdominal aorta from  the right common femoral artery.  The dilator removed.   Bilateral hand injection was used to delineate the bifurcation to the  sheath.  Once this was clearly delineated, 8 x 29 Genesis stents were  advanced on the OPTA balloons across the aortic bifurcation with a kissing-  balloon technique.  These were inflated at 8 atmospheres x30 seconds.  The  balloons were then deflated and removed.  Hand injection made through the  right femoral sheath verified good position of the stents with minimal  residual stenosis.  The pigtail catheter was then reinserted and a pelvic  arteriogram obtained.  This revealed excellent technical result.  Minimal  residual stenosis noted at the complex plaque at the left side of the common  iliac artery origin.  There were no significant flow-limiting lesions or  dissections present.   Bilateral oblique retrograde femoral arteriograms were obtained to assure  that the sheaths were in the common femoral arteries bilaterally.  The  common femoral arteries were then closed bilaterally with Angio-Seal.  There  were no apparent complications.   The patient was transferred to the holding area in stable condition.   FINAL IMPRESSION: 1.  Single widely patent bilateral renal arteries.  2.  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  3.  Successful angioplasty and stenting of common iliac arteries bilaterally      without apparent complication.  4.  AngioSeal closure of bilateral common femoral arteries.   DISPOSITION:  These results been reviewed with the patient and family.  The  patient will be followed up in the office in approximately 6 weeks. Kept off  work for 1 week.      PGH/MEDQ  D:  06/03/2004  T:  06/03/2004  Job:  [id number]   cc:   Gita Kudo, M.D.  [address]  [place]  [place] [address]   Gregary Signs A.  Everardo All, M.D. [facility]   [facility]

## 2010-08-05 NOTE — Cardiovascular Report (Signed)
NAME:  Jeremiah Hansen, Jeremiah Hansen NO.:  [id number]   MEDICAL RECORD NO.:  [id number]          PATIENT TYPE:  OIB   LOCATION:  2899                         FACILITY:  [facility]   PHYSICIAN:  Ricki Rodriguez, M.D.  DATE OF BIRTH:  1945/10/03   DATE OF PROCEDURE:  03/27/2006  DATE OF DISCHARGE:                            CARDIAC CATHETERIZATION   Procedure done as an outpatient.   Left heart catheterization, selective coronary angiography, left  ventricular function study.   INDICATIONS:  This 65 year old white male with known coronary artery  disease had chest pain with some atypical features and exertional  dyspnea on along with history of hyperlipidemia and recently quitting  smoking.   APPROACH:  Right femoral artery using 4-French sheath and catheters.   COMPLICATIONS:  None.   Fifty-five mL of dye was used.   HEMODYNAMIC DATA:  The left ventricular pressure was 127/14 and aortic  pressure was 127/64.   CORONARY ANATOMY:  The left main coronary artery was unremarkable but  short.   Left anterior descending coronary artery.  The left anterior descending  coronary artery had luminal irregularities, and diagonal vessel had 20-  30% ostial lesion.  Otherwise were unremarkable   Left circumflex coronary artery.  The left circumflex coronary artery  was unremarkable; however, its obtuse marginal branch had ostial 60%  stenosis with good flow and minimal improvement from previous study of 2  years ago.  It was a dominant vessel with posterolateral and posterior  descending coronary arteries unremarkable.   Right coronary artery.  The right coronary artery was nondominant and  had a mid vessel 40% long lesion.  This was somewhat improved from 2  years ago.   Left ventriculogram.  The left ventriculogram showed preserved left  ventricular systolic function with ejection fraction of 55%.   IMPRESSION:  1. Moderate obtuse marginal branch of the left circumflex coronary      artery disease.  2. Mild right coronary artery disease.  3. Preserved left ventricular systolic function.   RECOMMENDATIONS:  This patient will continue his medical therapy along  with smoking cessation and Lipitor 80 mg daily.  Will add a small dose  of Norvasc or nitroglycerin long-acting for additional benefit.      Ricki Rodriguez, M.D.  Electronically Signed     ASK/MEDQ  D:  03/27/2006  T:  03/27/2006  Job:  [id number]

## 2010-08-05 NOTE — H&P (Signed)
NAME:  Jeremiah Hansen, Jeremiah Hansen NO.:  [id number]   MEDICAL RECORD NO.:  [id number]                   PATIENT TYPE:  INP   LOCATION:  2931                                 FACILITY:  [facility]   PHYSICIAN:  Gordy Savers, M.D. [facility]      DATE OF BIRTH:  [birth year]   DATE OF ADMISSION:  06/11/2002  DATE OF DISCHARGE:                                HISTORY & PHYSICAL   CHIEF COMPLAINT:  Chest pain.   HISTORY OF PRESENT ILLNESS:  The patient is a 65 year old gentleman without  known prior cardiac disease. He was stable until the day of admission when  he developed severe mid sternal chest pain.  This radiated to the neck,  shoulder, and down the left arm.  It was described as a severe aching pain  and did seem to be aggravated by deep inspiration.  He also described some  mild dyspepsia-type symptoms.  Because of his persistent pain, he presented  to the emergency room for evaluation where pain was promptly relieved with  sublingual nitroglycerin.  He is now admitted for further evaluation and  treatment, suspecting unstable angina and to rule out an acute MI.   PAST MEDICAL HISTORY:  1. Cardiovascular disease.  2. Hypercholesterolemia.  3. Ongoing tobacco use.   He has no known prior history of exertional chest pain but for the past year  has had exertional right hip discomfort with normal radiographs.  Past  Medical History is otherwise fairly unremarkable.  He had an extensive  outpatient evaluation in January 2003 for left leg weakness and dyspraxia.  He states this evaluation was negative.  He was disabled for five months due  to this deficit, however. This has subsequently largely cleared  neurologically.  He has had one remote admission for gastroenteritis.   PAST SURGICAL HISTORY:  None.   ALLERGIES:  Denies any medical allergies.   CURRENT MEDICATIONS:  Mevacor, unclear dose, and aspirin daily.   REVIEW OF SYSTEMS:  As mentioned, positive for a  one-year history of right  hip claudication.   FAMILY HISTORY:  Father died at 76 of a massive MI with history of diabetes.  Mother died at 36 of complications of cancer unclear type. She also had  coronary artery disease.  Two brothers and three sisters, one brother with  history of prostate cancer, two sisters with diabetes.   SOCIAL HISTORY:  He is a one-pack-per-day smoker. Works as a Curator.  He  is accompanied by wife and adult sons.   PHYSICAL EXAMINATION:  VITAL SIGNS:  Blood pressure 114/72.  SKIN:  Unremarkable without rash or diaphoresis.  HEENT:  Normal pupillary responses.  Conjunctivae clear.  ENT negative.  NECK:  No bruits or neck vein distention.  No adenopathy.  CHEST:  Clear anterolaterally.  CARDIOVASCULAR:  S1 and S2 normal.  No murmurs or gallops.  ABDOMEN:  Soft and nontender.  No organomegaly.  The patient did have  bilateral femoral  bruits and a diminished right femoral pulse.  EXTREMITIES:  No edema.  Peripheral pulses were full on the left.  The right  posterior tibial pulse was not easily palpable, and the right dorsalis pedis  pulse was diminished compared to the left.  NEUROLOGIC:  Nonfocal.   IMPRESSION:  1. Unstable angina pectoris, rule out acute myocardial infarction.  2. 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  3. History of prior right brain stroke.  4. Ongoing tobacco use.  5. Hypercholesterolemia.   DISPOSITION:  The patient will be admitted to hospital to the CCU.  Serial  cardiac enzymes and EKGs will be reviewed.  He will be placed on IV  nitrates, heparin, and will treat with beta blocker therapy as well as  antiplatelet agents.  The patient will be seen in consultation by  cardiology.                                               Gordy Savers, M.D. [facility]    PFK/MEDQ  D:  06/11/2002  T:  06/12/2002  Job:  [id number]

## 2010-08-05 NOTE — Cardiovascular Report (Signed)
NAME:  MARCIA, LEPERA NO.:  [id number]   MEDICAL RECORD NO.:  [id number]           PATIENT TYPE:   LOCATION:                                 FACILITY:   PHYSICIAN:  Ricki Rodriguez, M.D.       DATE OF BIRTH:   DATE OF PROCEDURE:  01/26/2005  DATE OF DISCHARGE:                              CARDIAC CATHETERIZATION   PROCEDURE:  Left heart catheterization, selective coronary angiography, left  venous systolic function study.   INDICATIONS:  This 65 year old white male had exertional chest pain with a  known history of peripheral vascular disease, smoking, and elevated  cholesterol level.   APPROACH:  Right femoral artery using 4-French sheath.   COMPLICATIONS:  None.   HEMODYNAMIC DATA:  The left ventricular pressure was 126/8 and aortic  pressure was 120/74.   LEFT VENTRICULOGRAM:  The left ventriculogram showed normal left venous  systolic function with ejection fraction of 65%.   CORONARY ANATOMY:  The left main coronary artery was short and unremarkable.   LEFT ANTERIOR DESCENDING CORONARY ARTERY:  The left anterior descending  coronary artery had 10-20% proximal disease with 30% eccentric near diagonal  vessel origin and the vessel was wrapped arround the apex of the heart.  The  diagonal 1 had a concentric ostial 30% stenosis and diagonal 2 was  unremarkable.   LEFT CIRCUMFLEX CORONARY ARTERY:  The left circumflex coronary artery had  ostial obtuse marginal branch 60-70% stenosis, otherwise it had only luminal  irregularities.  It was a dominant vessel and the posterior descending  coronary artery was small and unremarkable.   RIGHT CORONARY ARTERY:  The right coronary artery was a smaller vessel with  an eccentric 70% proximal lesion.  The rest of the vessel was unremarkable.   IMPRESSION:  1.  Multivessel coronary artery disease.  2.  Preserved left ventricular systolic function.   RECOMMENDATIONS:  This patient will have aggressive medical  therapy, and if  it fails then he may undergo angioplasty of the right coronary artery and  obtuse marginal branch.      Ricki Rodriguez, M.D.  Electronically Signed     ASK/MEDQ  D:  01/26/2005  T:  01/26/2005  Job:  [id number]

## 2010-08-23 ENCOUNTER — Other Ambulatory Visit (HOSPITAL_COMMUNITY): Payer: Self-pay | Admitting: Cardiovascular Disease

## 2010-09-02 ENCOUNTER — Ambulatory Visit (HOSPITAL_COMMUNITY): Payer: Self-pay

## 2010-09-02 ENCOUNTER — Encounter (HOSPITAL_COMMUNITY)
Admission: RE | Admit: 2010-09-02 | Discharge: 2010-09-02 | Disposition: A | Discharge status: Home / Self Care | Payer: Medicare HMO | Source: Ambulatory Visit | Attending: Cardiovascular Disease | Admitting: Cardiovascular Disease

## 2010-09-02 DIAGNOSIS — R079 Chest pain, unspecified: Secondary | ICD-10-CM | POA: Insufficient documentation

## 2010-09-02 MED ORDER — TECHNETIUM TC 99M TETROFOSMIN IV KIT
10.0000 | PACK | Freq: Once | INTRAVENOUS | Status: AC | PRN
  Administered 2010-09-02: 10 via INTRAVENOUS

## 2010-09-02 MED ORDER — TECHNETIUM TC 99M TETROFOSMIN IV KIT
30.0000 | PACK | Freq: Once | INTRAVENOUS | Status: AC | PRN
  Administered 2010-09-02: 30 via INTRAVENOUS

## 2010-12-20 LAB — PROTIME-INR: Prothrombin Time: 14.6

## 2010-12-20 LAB — CBC
HCT: 41.9
Hemoglobin: 14
MCHC: 33.2
MCV: 93.9
MCV: 94.9
Platelets: 327
RBC: 4.44
RBC: 4.47
RDW: 12.5
RDW: 13

## 2010-12-20 LAB — COMPREHENSIVE METABOLIC PANEL
AST: 24
Albumin: 3.5
CO2: 27
Chloride: 103
GFR calc Af Amer: >60
Glucose, Bld: 119 — ABNORMAL HIGH
Sodium: 135
Total Bilirubin: 0.7

## 2010-12-20 LAB — POCT I-STAT, CHEM 8
BUN: 8
Calcium, Ion: 1.16
Glucose, Bld: 108 — ABNORMAL HIGH
HCT: 43
Sodium: 139
TCO2: 30

## 2010-12-20 LAB — DIFFERENTIAL
Basophils Absolute: 0
Lymphocytes Relative: 48 — ABNORMAL HIGH
Lymphs Abs: 3.1
Monocytes Absolute: 0.5
Neutro Abs: 2.7

## 2010-12-20 LAB — CARDIAC PANEL(CRET KIN+CKTOT+MB+TROPI)
Relative Index: 1.4
Troponin I: 0.01
Troponin I: 0.01

## 2010-12-20 LAB — MAGNESIUM: Magnesium: 2

## 2010-12-20 LAB — LIPID PANEL: HDL: 24 — ABNORMAL LOW

## 2012-01-29 ENCOUNTER — Encounter: Payer: Self-pay | Admitting: Vascular Surgery

## 2013-01-23 ENCOUNTER — Other Ambulatory Visit: Payer: Self-pay | Admitting: *Deleted

## 2013-01-23 DIAGNOSIS — Z48812 Encounter for surgical aftercare following surgery on the circulatory system: Secondary | ICD-10-CM

## 2013-01-23 DIAGNOSIS — I739 Peripheral vascular disease, unspecified: Secondary | ICD-10-CM

## 2013-02-04 ENCOUNTER — Encounter: Payer: Self-pay | Admitting: Family

## 2013-02-05 ENCOUNTER — Ambulatory Visit (INDEPENDENT_AMBULATORY_CARE_PROVIDER_SITE_OTHER)
Admission: RE | Admit: 2013-02-05 | Discharge: 2013-02-05 | Disposition: A | Discharge status: Home / Self Care | Payer: Managed Care, Other (non HMO) | Source: Ambulatory Visit | Attending: Family | Admitting: Family

## 2013-02-05 ENCOUNTER — Ambulatory Visit: Payer: Medicare HMO | Admitting: Family

## 2013-02-05 ENCOUNTER — Ambulatory Visit (HOSPITAL_COMMUNITY)
Admission: RE | Admit: 2013-02-05 | Discharge: 2013-02-05 | Disposition: A | Discharge status: Home / Self Care | Payer: Managed Care, Other (non HMO) | Source: Ambulatory Visit | Attending: Family | Admitting: Family

## 2013-02-05 ENCOUNTER — Encounter: Payer: Self-pay | Admitting: Family

## 2013-02-05 ENCOUNTER — Other Ambulatory Visit (HOSPITAL_COMMUNITY): Payer: Medicare HMO

## 2013-02-05 ENCOUNTER — Encounter (HOSPITAL_COMMUNITY): Payer: Medicare HMO

## 2013-02-05 ENCOUNTER — Encounter (INDEPENDENT_AMBULATORY_CARE_PROVIDER_SITE_OTHER): Payer: Self-pay

## 2013-02-05 ENCOUNTER — Ambulatory Visit (INDEPENDENT_AMBULATORY_CARE_PROVIDER_SITE_OTHER): Payer: Managed Care, Other (non HMO) | Admitting: Family

## 2013-02-05 VITALS — BP 130/82 | HR 74 | Resp 18 | Ht 69.0 in | Wt 178.0 lb

## 2013-02-05 DIAGNOSIS — Z48812 Encounter for surgical aftercare following surgery on the circulatory system: Secondary | ICD-10-CM | POA: Insufficient documentation

## 2013-02-05 DIAGNOSIS — I739 Peripheral vascular disease, unspecified: Secondary | ICD-10-CM | POA: Insufficient documentation

## 2013-02-05 NOTE — Patient Instructions (Addendum)
Peripheral Vascular Disease Peripheral Vascular Disease (PVD), also called Peripheral Arterial Disease (PAD), is a circulation problem caused by cholesterol (atherosclerotic plaque) deposits in the arteries. PVD commonly occurs in the lower extremities (legs) but it can occur in other areas of the body, such as your arms. The cholesterol buildup in the arteries reduces blood flow which can cause pain and other serious problems. The presence of PVD can place a person at risk for Coronary Artery Disease (CAD).  CAUSES  Causes of PVD can be many. It is usually associated with more than one risk factor such as:   High Cholesterol.  Smoking.  Diabetes.  Lack of exercise or inactivity.  High blood pressure (hypertension).  Obesity.  Family history. SYMPTOMS   When the lower extremities are affected, patients with PVD may experience:  Leg pain with exertion or physical activity. This is called INTERMITTENT CLAUDICATION. This may present as cramping or numbness with physical activity. The location of the pain is associated with the level of blockage. For example, blockage at the abdominal level (distal abdominal aorta) may result in buttock or hip pain. Lower leg arterial blockage may result in calf pain.  As PVD becomes more severe, pain can develop with less physical activity.  In people with severe PVD, leg pain may occur at rest.  Other PVD signs and symptoms:  Leg numbness or weakness.  Coldness in the affected leg or foot, especially when compared to the other leg.  A change in leg color.  Patients with significant PVD are more prone to ulcers or sores on toes, feet or legs. These may take longer to heal or may reoccur. The ulcers or sores can become infected.  If signs and symptoms of PVD are ignored, gangrene may occur. This can result in the loss of toes or loss of an entire limb.  Not all leg pain is related to PVD. Other medical conditions can cause leg pain such  as:  Blood clots (embolism) or Deep Vein Thrombosis.  Inflammation of the blood vessels (vasculitis).  Spinal stenosis. DIAGNOSIS  Diagnosis of PVD can involve several different types of tests. These can include:  Pulse Volume Recording Method (PVR). This test is simple, painless and does not involve the use of X-rays. PVR involves measuring and comparing the blood pressure in the arms and legs. An ABI (Ankle-Brachial Index) is calculated. The normal ratio of blood pressures is 1. As this number becomes smaller, it indicates more severe disease.  < 0.95  indicates significant narrowing in one or more leg vessels.  <0.8 there will usually be pain in the foot, leg or buttock with exercise.  <0.4 will usually have pain in the legs at rest.  <0.25  usually indicates limb threatening PVD.  Doppler detection of pulses in the legs. This test is painless and checks to see if you have a pulses in your legs/feet.  A dye or contrast material (a substance that highlights the blood vessels so they show up on x-ray) may be given to help your caregiver better see the arteries for the following tests. The dye is eliminated from your body by the kidney's. Your caregiver may order blood work to check your kidney function and other laboratory values before the following tests are performed:  Magnetic Resonance Angiography (MRA). An MRA is a picture study of the blood vessels and arteries. The MRA machine uses a large magnet to produce images of the blood vessels.  Computed Tomography Angiography (CTA). A CTA is a   specialized x-ray that looks at how the blood flows in your blood vessels. An IV may be inserted into your arm so contrast dye can be injected.  Angiogram. Is a procedure that uses x-rays to look at your blood vessels. This procedure is minimally invasive, meaning a small incision (cut) is made in your groin. A small tube (catheter) is then inserted into the artery of your groin. The catheter is  guided to the blood vessel or artery your caregiver wants to examine. Contrast dye is injected into the catheter. X-rays are then taken of the blood vessel or artery. After the images are obtained, the catheter is taken out. TREATMENT  Treatment of PVD involves many interventions which may include:  Lifestyle changes:  Quitting smoking.  Exercise.  Following a low fat, low cholesterol diet.  Control of diabetes.  Foot care is very important to the PVD patient. Good foot care can help prevent infection.  Medication:  Cholesterol-lowering medicine.  Blood pressure medicine.  Anti-platelet drugs.  Certain medicines may reduce symptoms of Intermittent Claudication.  Interventional/Surgical options:  Angioplasty. An Angioplasty is a procedure that inflates a balloon in the blocked artery. This opens the blocked artery to improve blood flow.  Stent Implant. A wire mesh tube (stent) is placed in the artery. The stent expands and stays in place, allowing the artery to remain open.  Peripheral Bypass Surgery. This is a surgical procedure that reroutes the blood around a blocked artery to help improve blood flow. This type of procedure may be performed if Angioplasty or stent implants are not an option. SEEK IMMEDIATE MEDICAL CARE IF:   You develop pain or numbness in your arms or legs.  Your arm or leg turns cold, becomes blue in color.  You develop redness, warmth, swelling and pain in your arms or legs. MAKE SURE YOU:   Understand these instructions.  Will watch your condition.  Will get help right away if you are not doing well or get worse. Document Released: 04/13/2004 Document Revised: 05/29/2011 Document Reviewed: 03/10/2008 ExitCare Patient Information 2014 [employer].   Smoking Cessation Quitting smoking is important to your health and has many advantages. However, it is not always easy to quit since nicotine is a very addictive drug. Often times, people try 3  times or more before being able to quit. This document explains the best ways for you to prepare to quit smoking. Quitting takes hard work and a lot of effort, but you can do it. ADVANTAGES OF QUITTING SMOKING  You will live longer, feel better, and live better.  Your body will feel the impact of quitting smoking almost immediately.  Within 20 minutes, blood pressure decreases. Your pulse returns to its normal level.  After 8 hours, carbon monoxide levels in the blood return to normal. Your oxygen level increases.  After 24 hours, the chance of having a heart attack starts to decrease. Your breath, hair, and body stop smelling like smoke.  After 48 hours, damaged nerve endings begin to recover. Your sense of taste and smell improve.  After 72 hours, the body is virtually free of nicotine. Your bronchial tubes relax and breathing becomes easier.  After 2 to 12 weeks, lungs can hold more air. Exercise becomes easier and circulation improves.  The risk of having a heart attack, stroke, cancer, or lung disease is greatly reduced.  After 1 year, the risk of coronary heart disease is cut in half.  After 5 years, the risk of stroke falls to   the same as a nonsmoker.  After 10 years, the risk of lung cancer is cut in half and the risk of other cancers decreases significantly.  After 15 years, the risk of coronary heart disease drops, usually to the level of a nonsmoker.  If you are pregnant, quitting smoking will improve your chances of having a healthy baby.  The people you live with, especially any children, will be healthier.  You will have extra money to spend on things other than cigarettes. QUESTIONS TO THINK ABOUT BEFORE ATTEMPTING TO QUIT You may want to talk about your answers with your caregiver.  Why do you want to quit?  If you tried to quit in the past, what helped and what did not?  What will be the most difficult situations for you after you quit? How will you plan to  handle them?  Who can help you through the tough times? Your family? Friends? A caregiver?  What pleasures do you get from smoking? What ways can you still get pleasure if you quit? Here are some questions to ask your caregiver:  How can you help me to be successful at quitting?  What medicine do you think would be best for me and how should I take it?  What should I do if I need more help?  What is smoking withdrawal like? How can I get information on withdrawal? GET READY  Set a quit date.  Change your environment by getting rid of all cigarettes, ashtrays, matches, and lighters in your home, car, or work. Do not let people smoke in your home.  Review your past attempts to quit. Think about what worked and what did not. GET SUPPORT AND ENCOURAGEMENT You have a better chance of being successful if you have help. You can get support in many ways.  Tell your family, friends, and co-workers that you are going to quit and need their support. Ask them not to smoke around you.  Get individual, group, or telephone counseling and support. Programs are available at local hospitals and health centers. Call your local health department for information about programs in your area.  Spiritual beliefs and practices may help some smokers quit.  Download a "quit meter" on your computer to keep track of quit statistics, such as how long you have gone without smoking, cigarettes not smoked, and money saved.  Get a self-help book about quitting smoking and staying off of tobacco. LEARN NEW SKILLS AND BEHAVIORS  Distract yourself from urges to smoke. Talk to someone, go for a walk, or occupy your time with a task.  Change your normal routine. Take a different route to work. Drink tea instead of coffee. Eat breakfast in a different place.  Reduce your stress. Take a hot bath, exercise, or read a book.  Plan something enjoyable to do every day. Reward yourself for not smoking.  Explore  interactive web-based programs that specialize in helping you quit. GET MEDICINE AND USE IT CORRECTLY Medicines can help you stop smoking and decrease the urge to smoke. Combining medicine with the above behavioral methods and support can greatly increase your chances of successfully quitting smoking.  Nicotine replacement therapy helps deliver nicotine to your body without the negative effects and risks of smoking. Nicotine replacement therapy includes nicotine gum, lozenges, inhalers, nasal sprays, and skin patches. Some may be available over-the-counter and others require a prescription.  Antidepressant medicine helps people abstain from smoking, but how this works is unknown. This medicine is available by prescription.    Nicotinic receptor partial agonist medicine simulates the effect of nicotine in your brain. This medicine is available by prescription. Ask your caregiver for advice about which medicines to use and how to use them based on your health history. Your caregiver will tell you what side effects to look out for if you choose to be on a medicine or therapy. Carefully read the information on the package. Do not use any other product containing nicotine while using a nicotine replacement product.  RELAPSE OR DIFFICULT SITUATIONS Most relapses occur within the first 3 months after quitting. Do not be discouraged if you start smoking again. Remember, most people try several times before finally quitting. You may have symptoms of withdrawal because your body is used to nicotine. You may crave cigarettes, be irritable, feel very hungry, cough often, get headaches, or have difficulty concentrating. The withdrawal symptoms are only temporary. They are strongest when you first quit, but they will go away within 10 14 days. To reduce the chances of relapse, try to:  Avoid drinking alcohol. Drinking lowers your chances of successfully quitting.  Reduce the amount of caffeine you consume. Once you  quit smoking, the amount of caffeine in your body increases and can give you symptoms, such as a rapid heartbeat, sweating, and anxiety.  Avoid smokers because they can make you want to smoke.  Do not let weight gain distract you. Many smokers will gain weight when they quit, usually less than 10 pounds. Eat a healthy diet and stay active. You can always lose the weight gained after you quit.  Find ways to improve your mood other than smoking. FOR MORE INFORMATION  www.smokefree.gov  Document Released: 02/28/2001 Document Revised: 09/05/2011 Document Reviewed: 06/15/2011 ExitCare Patient Information 2014 [employer].  

## 2013-02-05 NOTE — Progress Notes (Signed)
[facility] [place] HISTORY AND PHYSICAL -PAD  History of Present Illness Jeremiah Hansen is a 67 y.o. male who is s/p bil. CIA stents in possibly 2005 and FF BPG 2008 Dr. Madilyn Fireman.    Pt. reports claudication in thighs and hips after climbing 3 flights of stairs, this has improved from claudication in right hip after walking 80 feet since the above procedures. Walks about 100 yards on flat surface when left buttock and thigh start to hurt, relieved by rest. States night time legs cramps, denies non healing ulcers, denies history of stroke or TIA symptoms.  reports New Medical or Surgical History: Bil. cataracts extr.with IOL's.   Pt Diabetic: No Pt smoker: smoker  (1 ppd x 45 yrs)  Pt meds include: Statin :No, states his cholesterol is good, monitored by his cardiologist, who is his PCP, denies cardiac problems ASA: No Other anticoagulants/antiplatelets: Plavix  Past Medical History  Diagnosis Date  . Peripheral arterial disease   . Hypertension     Social History History  Substance Use Topics  . Smoking status: Current Every Day Smoker -- 50 years    Types: Cigarettes  . Smokeless tobacco: Former Neurosurgeon    Quit date: 02/05/1973  . Alcohol Use: No    Family History Family History  Problem Relation Age of Onset  . Cancer Mother   . Heart disease Mother   . Thyroid disease Mother     Past Surgical History  Procedure Laterality Date  . Femoral-femoral bypass graft      Allergies  Allergen Reactions  . Codeine Nausea Only    Current Outpatient Prescriptions  Medication Sig Dispense Refill  . amLODipine (NORVASC) 2.5 MG tablet Take 2.5 mg by mouth daily.      . clopidogrel (PLAVIX) 75 MG tablet Take 75 mg by mouth daily with breakfast.      . omeprazole (PRILOSEC) 40 MG capsule Take 40 mg by mouth daily.       No current facility-administered medications for this visit.    ROS: [x]  Positive   [ ]  Denies  General:[ ]  Weight loss,  [ ]   Weight gain, [ ]  Fever, [ ]  chills Neurologic: [ ]  Dizziness, [ ]  Blackouts, [ ]  Seizure [ ]  Stroke, [ ]  "Mini stroke", [ ]  Slurred speech, [ ]  Temporary blindness;  [ ] weakness, [ ]  Hoarseness Cardiac: [ ]  Chest pain/pressure, [ ]  Shortness of breath at rest [ ]  Shortness of breath with exertion,  [ ]   Atrial fibrillation or irregular heartbeat Vascular:[ ]  Pain in legs with walking, [ ]  Pain in legs at rest ,[ ]  Pain in legs at night,  [ ]   Non-healing ulcer, [ ]  Blood clot in vein/DVT,   Pulmonary: [ ]  Home oxygen, [ ]   Productive cough, [ ]  Coughing up blood,  [ ]  Asthma,  [ ]  Wheezing Musculoskeletal:  [ ]  Arthritis, [ ]  Low back pain,  [ ]  Joint pain Hematologic:[ ]  Easy Bruising, [ ]  Anemia; [ ]  Hepatitis Gastrointestinal: [ ]  Blood in stool,  [ ]  Gastroesophageal Reflux, [ ]  Trouble swallowing Urinary: [ ]  chronic Kidney disease, [ ]  on HD, [ ]  Burning with urination, [ ]  Frequent urination, [ ]  Difficulty urinating;  Skin: [ ]  Rashes, [ ]  Wounds     Physical Examination  Filed Vitals:   02/05/13 1300  BP: 130/82  Pulse: 74  Resp: 18   Filed Vitals:   02/05/13 1300  BP: 130/82  Pulse: 74  Resp: 18   Filed Weights   02/05/13 1300  Weight: 178 lb (80.74 kg)   Body mass index is 26.27 kg/(m^2).   General: A&O x 3, WDWN,  Gait: normal Eyes: Pupils are equal Pulmonary: CTAB, without wheezes , rales or rhonchi Cardiac: regular Rythm , without murmur          Carotid Bruits Left Right   Negative Negative  Aorta: is not palpable Radial pulses: are 2+ and=                           VASCULAR EXAM: Extremities without ischemic changes  without Gangrene; without open wounds.                                                                                                          LE Pulses LEFT RIGHT       FEMORAL   palpable   palpable        POPLITEAL  not palpable   not palpable       POSTERIOR TIBIAL  not palpable   not palpable        DORSALIS PEDIS       ANTERIOR TIBIAL  palpable   palpable    Abdomen: soft, NT, no masses. Skin: no rashes, no ulcers noted. Musculoskeletal: no muscle wasting or atrophy.  Neurologic: A&O X 3; Appropriate Affect ; SENSATION: normal; MOTOR FUNCTION:  moving all extremities equally, motor strength 5/5 throughout. Speech is fluent/normal. CN 2-12 intact.    Non-Invasive Vascular Imaging: DATE: 02/05/2013 ABI: RIGHT 0.94, Waveforms: B and T ;  LEFT 0.92, Waveforms: B DUPLEX SCAN OF BYPASS: Bilateral 50-99% CIA stenosis with 300-over 500 velocities in proximal to distal stent.  ASSESSMENT: Jeremiah Hansen is a 67 y.o. male who presents s/p  bil. CIA stents in possibly 2005 and FF BPG 2008 Dr. Madilyn Fireman. He has worsening claudication symptoms in both buttock and thighs after walking about 100 yards; unfortunately he continues to smoke, he was counseled re this.  Other risk factors for PAD are either not present or are addressed adequately.  PLAN:  I discussed in depth with the patient the nature of atherosclerosis, and emphasized the importance of maximal medical management including strict control of blood pressure, blood glucose, and lipid levels, obtaining regular exercise, and cessation of smoking.  The patient is aware that without maximal medical management the underlying atherosclerotic disease process will progress, limitingthe benefit of any interventions. Based on the patient's vascular studies and examination, and after discussing with Dr. Edilia Bo,  pt prefers return to clinic after the Holidays in 6  weeks, prior to this he will be scheduled for CT angiogram of abd/pelvis.   The patient was given information about PAD including signs, symptoms, treatment, what symptoms should prompt the patient to seek immediate medical care, and risk reduction measures to take.  Charisse March, RN, MSN, FNP-C [facility] Phone: [phone]  Clinic MD: Edilia Bo  02/05/2013  1:33 PM

## 2013-03-19 ENCOUNTER — Other Ambulatory Visit: Payer: Medicare Other

## 2013-03-19 ENCOUNTER — Ambulatory Visit: Payer: Medicare Other | Admitting: Vascular Surgery

## 2013-03-25 ENCOUNTER — Encounter: Payer: Self-pay | Admitting: Vascular Surgery

## 2013-03-26 ENCOUNTER — Encounter: Payer: Self-pay | Admitting: Vascular Surgery

## 2013-03-26 ENCOUNTER — Ambulatory Visit
Admission: RE | Admit: 2013-03-26 | Discharge: 2013-03-26 | Disposition: A | Discharge status: Home / Self Care | Payer: Managed Care, Other (non HMO) | Source: Ambulatory Visit | Attending: Vascular Surgery | Admitting: Vascular Surgery

## 2013-03-26 ENCOUNTER — Ambulatory Visit (INDEPENDENT_AMBULATORY_CARE_PROVIDER_SITE_OTHER): Payer: Medicare Other | Admitting: Vascular Surgery

## 2013-03-26 VITALS — BP 152/84 | HR 73 | Resp 18 | Ht 68.5 in | Wt 185.0 lb

## 2013-03-26 DIAGNOSIS — I739 Peripheral vascular disease, unspecified: Secondary | ICD-10-CM

## 2013-03-26 DIAGNOSIS — I6529 Occlusion and stenosis of unspecified carotid artery: Secondary | ICD-10-CM

## 2013-03-26 DIAGNOSIS — Z48812 Encounter for surgical aftercare following surgery on the circulatory system: Secondary | ICD-10-CM

## 2013-03-26 MED ORDER — IOHEXOL 350 MG/ML SOLN
80.0000 mL | Freq: Once | INTRAVENOUS | Status: AC | PRN
  Administered 2013-03-26: 80 mL via INTRAVENOUS

## 2013-03-26 NOTE — Assessment & Plan Note (Signed)
By CT scan his right common iliac artery stent is widely patent. His right to left fem-fem bypass graft is patent. He has a palpable dorsalis pedis and posterior tibial pulse the left foot. I do not think his pain in the left leg can be attributed to his peripheral vascular disease. I've encouraged him to stay as active as possible. We have also discussed the importance of tobacco cessation. This would help keep his stent open also and his bypass graft. If his pain in the left leg persists he should be considered for possible neurologic evaluation as this may be neurogenic pain or potentially arthritis. I've ordered follow up ABIs in the duplex of the stent in 6 months and I'll see him back at that time. He knows to call sooner for has problems.

## 2013-03-26 NOTE — Progress Notes (Signed)
[facility] [place]  Patient name: Jeremiah Hansen MRN: [id number] DOB: [birth year] Sex: male  REASON FOR VISIT: follow up of common iliac artery stents.  HPI: Jeremiah Hansen is a 68 y.o. male who was seen by Bonnee Quin on 02/05/2013. He is status post bilateral common iliac artery stents by Dr. Madilyn Fireman in the past. At his last visit, ABI on the right is 94% on the left 92%. Duplex scan suggested 50-99% common iliac artery stenoses on the right. Since his last visit he has undergone a CT angina to further evaluate his iliac disease.   He describes pain from his knee down on the left side which occurs with standing and sitting also. She also describes and hip pain with ambulation. He denies any rest pain or history of nonhealing ulcers.   Past Medical History  Diagnosis Date  . Peripheral arterial disease   . Hypertension    Family History  Problem Relation Age of Onset  . Cancer Mother   . Heart disease Mother   . Thyroid disease Mother    SOCIAL HISTORY: History  Substance Use Topics  . Smoking status: Current Every Day Smoker -- 50 years    Types: Cigarettes  . Smokeless tobacco: Former Neurosurgeon    Quit date: 02/05/1973  . Alcohol Use: No   Allergies  Allergen Reactions  . Codeine Nausea Only   Current Outpatient Prescriptions  Medication Sig Dispense Refill  . amLODipine (NORVASC) 2.5 MG tablet Take 2.5 mg by mouth daily.      . clopidogrel (PLAVIX) 75 MG tablet Take 75 mg by mouth daily with breakfast.      . omeprazole (PRILOSEC) 40 MG capsule Take 40 mg by mouth daily.       No current facility-administered medications for this visit.   REVIEW OF SYSTEMS: Arly.Keller ] denotes positive finding; [  ] denotes negative finding  CARDIOVASCULAR:  [ ]  chest pain   [ ]  chest pressure   [ ]  palpitations   [ ]  orthopnea   [ ]  dyspnea on exertion   [ ]  claudication   [ ]  rest pain   [ ]  DVT   [ ]  phlebitis PULMONARY:   [ ]  productive cough   [ ]  asthma    [ ]  wheezing NEUROLOGIC:   [ ]  weakness  [ ]  paresthesias  [ ]  aphasia  [ ]  amaurosis  [ ]  dizziness HEMATOLOGIC:   [ ]  bleeding problems   [ ]  clotting disorders MUSCULOSKELETAL:  [ ]  joint pain   [ ]  joint swelling [ ]  leg swelling GASTROINTESTINAL: [ ]   blood in stool  [ ]   hematemesis GENITOURINARY:  [ ]   dysuria  [ ]   hematuria PSYCHIATRIC:  [ ]  history of major depression INTEGUMENTARY:  [ ]  rashes  [ ]  ulcers CONSTITUTIONAL:  [ ]  fever   [ ]  chills  PHYSICAL EXAM: Filed Vitals:   03/26/13 1205  BP: 152/84  Pulse: 73  Resp: 18  Height: 5' 8.5" (1.74 m)  Weight: 185 lb (83.915 kg)   Body mass index is 27.72 kg/(m^2). GENERAL: The patient is a well-nourished male, in no acute distress. The vital signs are documented above. CARDIOVASCULAR: There is a regular rate and rhythm. I do not detect carotid bruits. He has palpable femoral pulses and palpable pedal pulses bilaterally. PULMONARY: There is good air exchange bilaterally without wheezing or rales. ABDOMEN: Soft and non-tender with normal pitched bowel sounds.  MUSCULOSKELETAL: There  are no major deformities or cyanosis. NEUROLOGIC: No focal weakness or paresthesias are detected. SKIN: There are no ulcers or rashes noted. PSYCHIATRIC: The patient has a normal affect.  DATA:  I have reviewed his CT scan which shows that his right common iliac artery stent is widely patent. The left common iliac artery stent is chronically occluded and he has a patent right to left fem-fem bypass graft. There are no areas of stenosis within the graft. The proximal superficial femoral artery on the left is patent.  MEDICAL ISSUES:  Peripheral vascular disease, unspecified By CT scan his right common iliac artery stent is widely patent. His right to left fem-fem bypass graft is patent. He has a palpable dorsalis pedis and posterior tibial pulse the left foot. I do not think his pain in the left leg can be attributed to his peripheral vascular  disease. I've encouraged him to stay as active as possible. We have also discussed the importance of tobacco cessation. This would help keep his stent open also and his bypass graft. If his pain in the left leg persists he should be considered for possible neurologic evaluation as this may be neurogenic pain or potentially arthritis. I've ordered follow up ABIs in the duplex of the stent in 6 months and I'll see him back at that time. He knows to call sooner for has problems.   Kirstina Leinweber S [facility] [place] Beeper: [phone]

## 2013-03-26 NOTE — Addendum Note (Signed)
Addended by: Sharee PimpleMCCHESNEY, Megen Madewell K on: 03/26/2013 05:47 PM   Modules accepted: Orders

## 2013-03-27 ENCOUNTER — Other Ambulatory Visit: Payer: Self-pay | Admitting: Cardiovascular Disease

## 2013-03-27 ENCOUNTER — Ambulatory Visit
Admission: RE | Admit: 2013-03-27 | Discharge: 2013-03-27 | Disposition: A | Discharge status: Home / Self Care | Payer: Managed Care, Other (non HMO) | Source: Ambulatory Visit | Attending: Cardiovascular Disease | Admitting: Cardiovascular Disease

## 2013-03-27 DIAGNOSIS — M79605 Pain in left leg: Secondary | ICD-10-CM

## 2013-03-27 DIAGNOSIS — M25552 Pain in left hip: Secondary | ICD-10-CM

## 2013-03-27 DIAGNOSIS — M79602 Pain in left arm: Secondary | ICD-10-CM

## 2013-03-28 ENCOUNTER — Other Ambulatory Visit: Payer: Self-pay | Admitting: Cardiovascular Disease

## 2013-03-28 DIAGNOSIS — M542 Cervicalgia: Secondary | ICD-10-CM

## 2013-04-02 ENCOUNTER — Ambulatory Visit
Admission: RE | Admit: 2013-04-02 | Discharge: 2013-04-02 | Disposition: A | Discharge status: Home / Self Care | Payer: Managed Care, Other (non HMO) | Source: Ambulatory Visit | Attending: Cardiovascular Disease | Admitting: Cardiovascular Disease

## 2013-04-02 DIAGNOSIS — M542 Cervicalgia: Secondary | ICD-10-CM

## 2013-09-24 ENCOUNTER — Encounter (HOSPITAL_COMMUNITY): Payer: Medicare Other

## 2013-09-24 ENCOUNTER — Other Ambulatory Visit (HOSPITAL_COMMUNITY): Payer: Medicare Other

## 2013-09-24 ENCOUNTER — Ambulatory Visit: Payer: Medicare Other | Admitting: Vascular Surgery

## 2013-09-30 ENCOUNTER — Other Ambulatory Visit: Payer: Self-pay | Admitting: Cardiovascular Disease

## 2013-09-30 ENCOUNTER — Ambulatory Visit
Admission: RE | Admit: 2013-09-30 | Discharge: 2013-09-30 | Disposition: A | Discharge status: Home / Self Care | Payer: Managed Care, Other (non HMO) | Source: Ambulatory Visit | Attending: Cardiovascular Disease | Admitting: Cardiovascular Disease

## 2013-09-30 DIAGNOSIS — R1013 Epigastric pain: Secondary | ICD-10-CM

## 2013-10-14 ENCOUNTER — Encounter: Payer: Self-pay | Admitting: Family

## 2013-10-15 ENCOUNTER — Encounter (HOSPITAL_COMMUNITY): Payer: Medicare Other

## 2013-10-15 ENCOUNTER — Other Ambulatory Visit (HOSPITAL_COMMUNITY): Payer: Medicare Other

## 2013-10-15 ENCOUNTER — Ambulatory Visit: Payer: Medicare Other | Admitting: Family

## 2013-10-15 ENCOUNTER — Inpatient Hospital Stay (HOSPITAL_COMMUNITY)
Admission: RE | Admit: 2013-10-15 | Discharge: 2013-10-15 | Disposition: A | Discharge status: Home / Self Care | Payer: Medicare Other | Source: Ambulatory Visit | Attending: Vascular Surgery | Admitting: Vascular Surgery

## 2013-10-15 DIAGNOSIS — Z48812 Encounter for surgical aftercare following surgery on the circulatory system: Secondary | ICD-10-CM

## 2013-10-15 DIAGNOSIS — I739 Peripheral vascular disease, unspecified: Secondary | ICD-10-CM

## 2014-05-29 DIAGNOSIS — Z0100 Encounter for examination of eyes and vision without abnormal findings: Secondary | ICD-10-CM | POA: Diagnosis not present

## 2014-05-29 DIAGNOSIS — I10 Essential (primary) hypertension: Secondary | ICD-10-CM | POA: Diagnosis not present

## 2014-06-06 DIAGNOSIS — R21 Rash and other nonspecific skin eruption: Secondary | ICD-10-CM | POA: Diagnosis not present

## 2014-06-06 DIAGNOSIS — F17210 Nicotine dependence, cigarettes, uncomplicated: Secondary | ICD-10-CM | POA: Diagnosis not present

## 2015-03-03 ENCOUNTER — Encounter: Payer: Self-pay | Admitting: Internal Medicine

## 2017-04-23 DIAGNOSIS — M7550 Bursitis of unspecified shoulder: Secondary | ICD-10-CM | POA: Diagnosis not present

## 2017-04-23 DIAGNOSIS — M542 Cervicalgia: Secondary | ICD-10-CM | POA: Diagnosis not present

## 2017-04-25 DIAGNOSIS — M50122 Cervical disc disorder at C5-C6 level with radiculopathy: Secondary | ICD-10-CM | POA: Diagnosis not present

## 2017-04-25 DIAGNOSIS — I2510 Atherosclerotic heart disease of native coronary artery without angina pectoris: Secondary | ICD-10-CM | POA: Diagnosis not present

## 2017-04-25 DIAGNOSIS — M25511 Pain in right shoulder: Secondary | ICD-10-CM | POA: Diagnosis not present

## 2017-04-25 DIAGNOSIS — E7849 Other hyperlipidemia: Secondary | ICD-10-CM | POA: Diagnosis not present

## 2017-05-03 DIAGNOSIS — I2510 Atherosclerotic heart disease of native coronary artery without angina pectoris: Secondary | ICD-10-CM | POA: Diagnosis not present

## 2017-05-03 DIAGNOSIS — M50122 Cervical disc disorder at C5-C6 level with radiculopathy: Secondary | ICD-10-CM | POA: Diagnosis not present

## 2017-05-03 DIAGNOSIS — E7849 Other hyperlipidemia: Secondary | ICD-10-CM | POA: Diagnosis not present

## 2017-05-03 DIAGNOSIS — M25511 Pain in right shoulder: Secondary | ICD-10-CM | POA: Diagnosis not present

## 2017-06-22 ENCOUNTER — Other Ambulatory Visit: Payer: Self-pay | Admitting: Cardiovascular Disease

## 2017-06-25 DIAGNOSIS — I2510 Atherosclerotic heart disease of native coronary artery without angina pectoris: Secondary | ICD-10-CM | POA: Diagnosis not present

## 2017-06-25 DIAGNOSIS — E7849 Other hyperlipidemia: Secondary | ICD-10-CM | POA: Diagnosis not present

## 2017-06-25 DIAGNOSIS — M25511 Pain in right shoulder: Secondary | ICD-10-CM | POA: Diagnosis not present

## 2017-06-25 DIAGNOSIS — M50122 Cervical disc disorder at C5-C6 level with radiculopathy: Secondary | ICD-10-CM | POA: Diagnosis not present

## 2017-06-26 ENCOUNTER — Other Ambulatory Visit: Payer: Self-pay | Admitting: Cardiovascular Disease

## 2017-06-26 DIAGNOSIS — M50123 Cervical disc disorder at C6-C7 level with radiculopathy: Secondary | ICD-10-CM

## 2017-06-26 DIAGNOSIS — M7501 Adhesive capsulitis of right shoulder: Secondary | ICD-10-CM

## 2017-06-30 ENCOUNTER — Ambulatory Visit
Admission: RE | Admit: 2017-06-30 | Discharge: 2017-06-30 | Disposition: A | Discharge status: Home / Self Care | Payer: Managed Care, Other (non HMO) | Source: Ambulatory Visit | Attending: Cardiovascular Disease | Admitting: Cardiovascular Disease

## 2017-06-30 DIAGNOSIS — M7501 Adhesive capsulitis of right shoulder: Secondary | ICD-10-CM

## 2017-06-30 DIAGNOSIS — M50123 Cervical disc disorder at C6-C7 level with radiculopathy: Secondary | ICD-10-CM

## 2017-06-30 DIAGNOSIS — M75121 Complete rotator cuff tear or rupture of right shoulder, not specified as traumatic: Secondary | ICD-10-CM | POA: Diagnosis not present

## 2017-06-30 DIAGNOSIS — M4802 Spinal stenosis, cervical region: Secondary | ICD-10-CM | POA: Diagnosis not present

## 2017-07-10 DIAGNOSIS — Z6825 Body mass index (BMI) 25.0-25.9, adult: Secondary | ICD-10-CM | POA: Diagnosis not present

## 2017-07-10 DIAGNOSIS — M4722 Other spondylosis with radiculopathy, cervical region: Secondary | ICD-10-CM | POA: Diagnosis not present

## 2017-07-10 DIAGNOSIS — M25511 Pain in right shoulder: Secondary | ICD-10-CM | POA: Diagnosis not present

## 2017-07-10 DIAGNOSIS — M542 Cervicalgia: Secondary | ICD-10-CM | POA: Diagnosis not present

## 2017-07-17 DIAGNOSIS — M25511 Pain in right shoulder: Secondary | ICD-10-CM | POA: Diagnosis not present

## 2017-07-18 DIAGNOSIS — L089 Local infection of the skin and subcutaneous tissue, unspecified: Secondary | ICD-10-CM | POA: Diagnosis not present

## 2017-07-18 DIAGNOSIS — M67911 Unspecified disorder of synovium and tendon, right shoulder: Secondary | ICD-10-CM | POA: Diagnosis not present

## 2017-07-18 DIAGNOSIS — M5030 Other cervical disc degeneration, unspecified cervical region: Secondary | ICD-10-CM | POA: Diagnosis not present

## 2017-07-18 DIAGNOSIS — K219 Gastro-esophageal reflux disease without esophagitis: Secondary | ICD-10-CM | POA: Diagnosis not present

## 2017-07-18 DIAGNOSIS — Z1211 Encounter for screening for malignant neoplasm of colon: Secondary | ICD-10-CM | POA: Diagnosis not present

## 2017-07-18 DIAGNOSIS — Z01818 Encounter for other preprocedural examination: Secondary | ICD-10-CM | POA: Diagnosis not present

## 2017-07-18 DIAGNOSIS — Z720 Tobacco use: Secondary | ICD-10-CM | POA: Diagnosis not present

## 2017-07-18 DIAGNOSIS — I2510 Atherosclerotic heart disease of native coronary artery without angina pectoris: Secondary | ICD-10-CM | POA: Diagnosis not present

## 2017-07-18 DIAGNOSIS — I739 Peripheral vascular disease, unspecified: Secondary | ICD-10-CM | POA: Diagnosis not present

## 2017-07-24 DIAGNOSIS — I361 Nonrheumatic tricuspid (valve) insufficiency: Secondary | ICD-10-CM | POA: Diagnosis not present

## 2017-07-24 DIAGNOSIS — I2510 Atherosclerotic heart disease of native coronary artery without angina pectoris: Secondary | ICD-10-CM | POA: Diagnosis not present

## 2017-07-24 DIAGNOSIS — I340 Nonrheumatic mitral (valve) insufficiency: Secondary | ICD-10-CM | POA: Diagnosis not present

## 2017-08-10 DIAGNOSIS — J449 Chronic obstructive pulmonary disease, unspecified: Secondary | ICD-10-CM | POA: Diagnosis not present

## 2017-08-10 DIAGNOSIS — I361 Nonrheumatic tricuspid (valve) insufficiency: Secondary | ICD-10-CM | POA: Diagnosis not present

## 2017-08-10 DIAGNOSIS — I340 Nonrheumatic mitral (valve) insufficiency: Secondary | ICD-10-CM | POA: Diagnosis not present

## 2017-08-10 DIAGNOSIS — M25511 Pain in right shoulder: Secondary | ICD-10-CM | POA: Diagnosis not present

## 2017-08-10 DIAGNOSIS — I2510 Atherosclerotic heart disease of native coronary artery without angina pectoris: Secondary | ICD-10-CM | POA: Diagnosis not present

## 2017-08-10 DIAGNOSIS — M50122 Cervical disc disorder at C5-C6 level with radiculopathy: Secondary | ICD-10-CM | POA: Diagnosis not present

## 2017-08-22 DIAGNOSIS — M19011 Primary osteoarthritis, right shoulder: Secondary | ICD-10-CM | POA: Diagnosis not present

## 2017-08-22 DIAGNOSIS — M7541 Impingement syndrome of right shoulder: Secondary | ICD-10-CM | POA: Diagnosis not present

## 2017-08-22 DIAGNOSIS — M24111 Other articular cartilage disorders, right shoulder: Secondary | ICD-10-CM | POA: Diagnosis not present

## 2017-08-22 DIAGNOSIS — M7581 Other shoulder lesions, right shoulder: Secondary | ICD-10-CM | POA: Diagnosis not present

## 2017-08-22 DIAGNOSIS — G8918 Other acute postprocedural pain: Secondary | ICD-10-CM | POA: Diagnosis not present

## 2017-08-28 DIAGNOSIS — M19011 Primary osteoarthritis, right shoulder: Secondary | ICD-10-CM | POA: Diagnosis not present

## 2017-08-29 DIAGNOSIS — M7541 Impingement syndrome of right shoulder: Secondary | ICD-10-CM | POA: Diagnosis not present

## 2017-08-29 DIAGNOSIS — M25611 Stiffness of right shoulder, not elsewhere classified: Secondary | ICD-10-CM | POA: Diagnosis not present

## 2017-08-29 DIAGNOSIS — M25511 Pain in right shoulder: Secondary | ICD-10-CM | POA: Diagnosis not present

## 2017-09-05 DIAGNOSIS — M7541 Impingement syndrome of right shoulder: Secondary | ICD-10-CM | POA: Diagnosis not present

## 2017-09-05 DIAGNOSIS — M25611 Stiffness of right shoulder, not elsewhere classified: Secondary | ICD-10-CM | POA: Diagnosis not present

## 2017-09-05 DIAGNOSIS — M25511 Pain in right shoulder: Secondary | ICD-10-CM | POA: Diagnosis not present

## 2017-09-06 DIAGNOSIS — M7541 Impingement syndrome of right shoulder: Secondary | ICD-10-CM | POA: Diagnosis not present

## 2017-09-06 DIAGNOSIS — M25511 Pain in right shoulder: Secondary | ICD-10-CM | POA: Diagnosis not present

## 2017-09-06 DIAGNOSIS — M25611 Stiffness of right shoulder, not elsewhere classified: Secondary | ICD-10-CM | POA: Diagnosis not present

## 2017-09-11 DIAGNOSIS — M7541 Impingement syndrome of right shoulder: Secondary | ICD-10-CM | POA: Diagnosis not present

## 2017-09-11 DIAGNOSIS — M25611 Stiffness of right shoulder, not elsewhere classified: Secondary | ICD-10-CM | POA: Diagnosis not present

## 2017-09-11 DIAGNOSIS — M25511 Pain in right shoulder: Secondary | ICD-10-CM | POA: Diagnosis not present

## 2017-09-12 DIAGNOSIS — M25511 Pain in right shoulder: Secondary | ICD-10-CM | POA: Diagnosis not present

## 2017-09-12 DIAGNOSIS — M7541 Impingement syndrome of right shoulder: Secondary | ICD-10-CM | POA: Diagnosis not present

## 2017-09-12 DIAGNOSIS — M25611 Stiffness of right shoulder, not elsewhere classified: Secondary | ICD-10-CM | POA: Diagnosis not present

## 2017-09-18 DIAGNOSIS — M25511 Pain in right shoulder: Secondary | ICD-10-CM | POA: Diagnosis not present

## 2017-09-18 DIAGNOSIS — M25611 Stiffness of right shoulder, not elsewhere classified: Secondary | ICD-10-CM | POA: Diagnosis not present

## 2017-09-18 DIAGNOSIS — M7541 Impingement syndrome of right shoulder: Secondary | ICD-10-CM | POA: Diagnosis not present

## 2017-09-19 DIAGNOSIS — M7541 Impingement syndrome of right shoulder: Secondary | ICD-10-CM | POA: Diagnosis not present

## 2017-09-19 DIAGNOSIS — M25611 Stiffness of right shoulder, not elsewhere classified: Secondary | ICD-10-CM | POA: Diagnosis not present

## 2017-09-19 DIAGNOSIS — M25511 Pain in right shoulder: Secondary | ICD-10-CM | POA: Diagnosis not present

## 2017-09-25 DIAGNOSIS — M25511 Pain in right shoulder: Secondary | ICD-10-CM | POA: Diagnosis not present

## 2017-09-25 DIAGNOSIS — M7541 Impingement syndrome of right shoulder: Secondary | ICD-10-CM | POA: Diagnosis not present

## 2017-09-25 DIAGNOSIS — M25611 Stiffness of right shoulder, not elsewhere classified: Secondary | ICD-10-CM | POA: Diagnosis not present

## 2017-09-26 DIAGNOSIS — M25511 Pain in right shoulder: Secondary | ICD-10-CM | POA: Diagnosis not present

## 2017-09-26 DIAGNOSIS — M25611 Stiffness of right shoulder, not elsewhere classified: Secondary | ICD-10-CM | POA: Diagnosis not present

## 2017-09-26 DIAGNOSIS — M7541 Impingement syndrome of right shoulder: Secondary | ICD-10-CM | POA: Diagnosis not present

## 2017-10-03 DIAGNOSIS — M7541 Impingement syndrome of right shoulder: Secondary | ICD-10-CM | POA: Diagnosis not present

## 2017-10-03 DIAGNOSIS — M25511 Pain in right shoulder: Secondary | ICD-10-CM | POA: Diagnosis not present

## 2017-10-03 DIAGNOSIS — M25611 Stiffness of right shoulder, not elsewhere classified: Secondary | ICD-10-CM | POA: Diagnosis not present

## 2017-10-05 DIAGNOSIS — M25611 Stiffness of right shoulder, not elsewhere classified: Secondary | ICD-10-CM | POA: Diagnosis not present

## 2017-10-05 DIAGNOSIS — M7541 Impingement syndrome of right shoulder: Secondary | ICD-10-CM | POA: Diagnosis not present

## 2017-10-05 DIAGNOSIS — M25511 Pain in right shoulder: Secondary | ICD-10-CM | POA: Diagnosis not present

## 2017-10-08 DIAGNOSIS — M19011 Primary osteoarthritis, right shoulder: Secondary | ICD-10-CM | POA: Diagnosis not present

## 2017-10-09 DIAGNOSIS — M25511 Pain in right shoulder: Secondary | ICD-10-CM | POA: Diagnosis not present

## 2017-10-09 DIAGNOSIS — M25611 Stiffness of right shoulder, not elsewhere classified: Secondary | ICD-10-CM | POA: Diagnosis not present

## 2017-10-09 DIAGNOSIS — M7541 Impingement syndrome of right shoulder: Secondary | ICD-10-CM | POA: Diagnosis not present

## 2017-10-10 DIAGNOSIS — M25511 Pain in right shoulder: Secondary | ICD-10-CM | POA: Diagnosis not present

## 2017-10-10 DIAGNOSIS — M25611 Stiffness of right shoulder, not elsewhere classified: Secondary | ICD-10-CM | POA: Diagnosis not present

## 2017-10-10 DIAGNOSIS — M7541 Impingement syndrome of right shoulder: Secondary | ICD-10-CM | POA: Diagnosis not present

## 2017-10-17 DIAGNOSIS — M7541 Impingement syndrome of right shoulder: Secondary | ICD-10-CM | POA: Diagnosis not present

## 2017-10-17 DIAGNOSIS — M25611 Stiffness of right shoulder, not elsewhere classified: Secondary | ICD-10-CM | POA: Diagnosis not present

## 2017-10-17 DIAGNOSIS — M25511 Pain in right shoulder: Secondary | ICD-10-CM | POA: Diagnosis not present

## 2017-10-18 DIAGNOSIS — M25511 Pain in right shoulder: Secondary | ICD-10-CM | POA: Diagnosis not present

## 2017-10-18 DIAGNOSIS — M25611 Stiffness of right shoulder, not elsewhere classified: Secondary | ICD-10-CM | POA: Diagnosis not present

## 2017-10-18 DIAGNOSIS — M7541 Impingement syndrome of right shoulder: Secondary | ICD-10-CM | POA: Diagnosis not present

## 2017-10-23 DIAGNOSIS — M7541 Impingement syndrome of right shoulder: Secondary | ICD-10-CM | POA: Diagnosis not present

## 2017-10-23 DIAGNOSIS — M25611 Stiffness of right shoulder, not elsewhere classified: Secondary | ICD-10-CM | POA: Diagnosis not present

## 2017-10-23 DIAGNOSIS — M25511 Pain in right shoulder: Secondary | ICD-10-CM | POA: Diagnosis not present

## 2017-10-31 DIAGNOSIS — M25611 Stiffness of right shoulder, not elsewhere classified: Secondary | ICD-10-CM | POA: Diagnosis not present

## 2017-10-31 DIAGNOSIS — M7541 Impingement syndrome of right shoulder: Secondary | ICD-10-CM | POA: Diagnosis not present

## 2017-10-31 DIAGNOSIS — M25511 Pain in right shoulder: Secondary | ICD-10-CM | POA: Diagnosis not present

## 2017-11-01 DIAGNOSIS — M25611 Stiffness of right shoulder, not elsewhere classified: Secondary | ICD-10-CM | POA: Diagnosis not present

## 2017-11-01 DIAGNOSIS — M25511 Pain in right shoulder: Secondary | ICD-10-CM | POA: Diagnosis not present

## 2017-11-01 DIAGNOSIS — M7541 Impingement syndrome of right shoulder: Secondary | ICD-10-CM | POA: Diagnosis not present

## 2017-11-05 DIAGNOSIS — M19011 Primary osteoarthritis, right shoulder: Secondary | ICD-10-CM | POA: Diagnosis not present

## 2017-11-06 DIAGNOSIS — M25611 Stiffness of right shoulder, not elsewhere classified: Secondary | ICD-10-CM | POA: Diagnosis not present

## 2017-11-06 DIAGNOSIS — M25511 Pain in right shoulder: Secondary | ICD-10-CM | POA: Diagnosis not present

## 2017-11-06 DIAGNOSIS — M7541 Impingement syndrome of right shoulder: Secondary | ICD-10-CM | POA: Diagnosis not present

## 2017-11-08 DIAGNOSIS — M25611 Stiffness of right shoulder, not elsewhere classified: Secondary | ICD-10-CM | POA: Diagnosis not present

## 2017-11-08 DIAGNOSIS — M7541 Impingement syndrome of right shoulder: Secondary | ICD-10-CM | POA: Diagnosis not present

## 2017-11-08 DIAGNOSIS — M25511 Pain in right shoulder: Secondary | ICD-10-CM | POA: Diagnosis not present

## 2017-11-13 DIAGNOSIS — M25511 Pain in right shoulder: Secondary | ICD-10-CM | POA: Diagnosis not present

## 2017-11-13 DIAGNOSIS — M7541 Impingement syndrome of right shoulder: Secondary | ICD-10-CM | POA: Diagnosis not present

## 2017-11-13 DIAGNOSIS — M25611 Stiffness of right shoulder, not elsewhere classified: Secondary | ICD-10-CM | POA: Diagnosis not present

## 2017-11-14 DIAGNOSIS — M25511 Pain in right shoulder: Secondary | ICD-10-CM | POA: Diagnosis not present

## 2017-11-14 DIAGNOSIS — M25611 Stiffness of right shoulder, not elsewhere classified: Secondary | ICD-10-CM | POA: Diagnosis not present

## 2017-11-14 DIAGNOSIS — M7541 Impingement syndrome of right shoulder: Secondary | ICD-10-CM | POA: Diagnosis not present

## 2017-11-21 DIAGNOSIS — M7541 Impingement syndrome of right shoulder: Secondary | ICD-10-CM | POA: Diagnosis not present

## 2017-11-21 DIAGNOSIS — M25511 Pain in right shoulder: Secondary | ICD-10-CM | POA: Diagnosis not present

## 2017-11-21 DIAGNOSIS — M25611 Stiffness of right shoulder, not elsewhere classified: Secondary | ICD-10-CM | POA: Diagnosis not present

## 2017-11-22 DIAGNOSIS — M25511 Pain in right shoulder: Secondary | ICD-10-CM | POA: Diagnosis not present

## 2017-11-22 DIAGNOSIS — M7541 Impingement syndrome of right shoulder: Secondary | ICD-10-CM | POA: Diagnosis not present

## 2017-11-22 DIAGNOSIS — M25611 Stiffness of right shoulder, not elsewhere classified: Secondary | ICD-10-CM | POA: Diagnosis not present

## 2017-11-24 ENCOUNTER — Emergency Department (HOSPITAL_COMMUNITY): Payer: Medicare HMO

## 2017-11-24 ENCOUNTER — Emergency Department (HOSPITAL_COMMUNITY)
Admission: EM | Admit: 2017-11-24 | Discharge: 2017-11-24 | Disposition: A | Discharge status: Home / Self Care | Payer: Medicare HMO | Attending: Emergency Medicine | Admitting: Emergency Medicine

## 2017-11-24 ENCOUNTER — Encounter (HOSPITAL_COMMUNITY): Payer: Self-pay

## 2017-11-24 DIAGNOSIS — R079 Chest pain, unspecified: Secondary | ICD-10-CM | POA: Diagnosis not present

## 2017-11-24 DIAGNOSIS — Z7902 Long term (current) use of antithrombotics/antiplatelets: Secondary | ICD-10-CM | POA: Insufficient documentation

## 2017-11-24 DIAGNOSIS — I2510 Atherosclerotic heart disease of native coronary artery without angina pectoris: Secondary | ICD-10-CM | POA: Insufficient documentation

## 2017-11-24 DIAGNOSIS — Y92410 Unspecified street and highway as the place of occurrence of the external cause: Secondary | ICD-10-CM | POA: Insufficient documentation

## 2017-11-24 DIAGNOSIS — S62337A Displaced fracture of neck of fifth metacarpal bone, left hand, initial encounter for closed fracture: Secondary | ICD-10-CM | POA: Insufficient documentation

## 2017-11-24 DIAGNOSIS — Z7982 Long term (current) use of aspirin: Secondary | ICD-10-CM | POA: Insufficient documentation

## 2017-11-24 DIAGNOSIS — R51 Headache: Secondary | ICD-10-CM | POA: Insufficient documentation

## 2017-11-24 DIAGNOSIS — Y9389 Activity, other specified: Secondary | ICD-10-CM | POA: Diagnosis not present

## 2017-11-24 DIAGNOSIS — F17200 Nicotine dependence, unspecified, uncomplicated: Secondary | ICD-10-CM | POA: Diagnosis not present

## 2017-11-24 DIAGNOSIS — R109 Unspecified abdominal pain: Secondary | ICD-10-CM | POA: Diagnosis not present

## 2017-11-24 DIAGNOSIS — S2232XA Fracture of one rib, left side, initial encounter for closed fracture: Secondary | ICD-10-CM | POA: Insufficient documentation

## 2017-11-24 DIAGNOSIS — Y999 Unspecified external cause status: Secondary | ICD-10-CM | POA: Insufficient documentation

## 2017-11-24 DIAGNOSIS — S20211A Contusion of right front wall of thorax, initial encounter: Secondary | ICD-10-CM | POA: Insufficient documentation

## 2017-11-24 DIAGNOSIS — S62331A Displaced fracture of neck of second metacarpal bone, left hand, initial encounter for closed fracture: Secondary | ICD-10-CM | POA: Diagnosis not present

## 2017-11-24 DIAGNOSIS — S6992XA Unspecified injury of left wrist, hand and finger(s), initial encounter: Secondary | ICD-10-CM | POA: Diagnosis present

## 2017-11-24 DIAGNOSIS — R0789 Other chest pain: Secondary | ICD-10-CM | POA: Diagnosis not present

## 2017-11-24 HISTORY — DX: Diaphragmatic hernia without obstruction or gangrene: K44.9

## 2017-11-24 LAB — CBC
HCT: 42.2 % (ref 39.0–52.0)
HEMOGLOBIN: 13.6 g/dL (ref 13.0–17.0)
MCH: 30.6 pg (ref 26.0–34.0)
MCHC: 32.2 g/dL (ref 30.0–36.0)
MCV: 95 fL (ref 78.0–100.0)
Platelets: 299 10*3/uL (ref 150–400)
RBC: 4.44 MIL/uL (ref 4.22–5.81)
RDW: 12.5 % (ref 11.5–15.5)
WBC: 8 10*3/uL (ref 4.0–10.5)

## 2017-11-24 LAB — I-STAT CHEM 8, ED
BUN: 4 mg/dL — ABNORMAL LOW (ref 8–23)
CALCIUM ION: 1.14 mmol/L — AB (ref 1.15–1.40)
CHLORIDE: 105 mmol/L (ref 98–111)
CREATININE: 1 mg/dL (ref 0.61–1.24)
GLUCOSE: 108 mg/dL — AB (ref 70–99)
HCT: 42 % (ref 39.0–52.0)
Hemoglobin: 14.3 g/dL (ref 13.0–17.0)
Potassium: 3.6 mmol/L (ref 3.5–5.1)
Sodium: 140 mmol/L (ref 135–145)
TCO2: 23 mmol/L (ref 22–32)

## 2017-11-24 LAB — CDS SEROLOGY

## 2017-11-24 LAB — COMPREHENSIVE METABOLIC PANEL
ALK PHOS: 68 U/L (ref 38–126)
ALT: 39 U/L (ref 0–44)
ANION GAP: 11 (ref 5–15)
AST: 43 U/L — ABNORMAL HIGH (ref 15–41)
Albumin: 3.8 g/dL (ref 3.5–5.0)
BILIRUBIN TOTAL: 1.1 mg/dL (ref 0.3–1.2)
BUN: 5 mg/dL — ABNORMAL LOW (ref 8–23)
CO2: 23 mmol/L (ref 22–32)
Calcium: 9.2 mg/dL (ref 8.9–10.3)
Chloride: 105 mmol/L (ref 98–111)
Creatinine, Ser: 1.03 mg/dL (ref 0.61–1.24)
GFR calc Af Amer: >60 mL/min (ref >60)
GLUCOSE: 108 mg/dL — AB (ref 70–99)
Potassium: 3.7 mmol/L (ref 3.5–5.1)
Sodium: 139 mmol/L (ref 135–145)
TOTAL PROTEIN: 7.5 g/dL (ref 6.5–8.1)

## 2017-11-24 LAB — I-STAT CG4 LACTIC ACID, ED: Lactic Acid, Venous: 1.14 mmol/L (ref 0.5–1.9)

## 2017-11-24 LAB — PROTIME-INR
INR: 1.1
Prothrombin Time: 14.1 seconds (ref 11.4–15.2)

## 2017-11-24 LAB — ETHANOL: Alcohol, Ethyl (B): <10 mg/dL (ref <10)

## 2017-11-24 LAB — SAMPLE TO BLOOD BANK

## 2017-11-24 MED ORDER — DOCUSATE SODIUM 100 MG PO CAPS: 100.0000 mg | ORAL_CAPSULE | Freq: Two times a day (BID) | ORAL | 0 refills | Status: AC

## 2017-11-24 MED ORDER — OXYCODONE-ACETAMINOPHEN 5-325 MG PO TABS: 2.0000 | ORAL_TABLET | Freq: Once | ORAL | Status: DC

## 2017-11-24 MED ORDER — IOPAMIDOL (ISOVUE-300) INJECTION 61%
INTRAVENOUS | Status: AC
  Administered 2017-11-24: 100 mL
  Filled 2017-11-24: qty 100

## 2017-11-24 MED ORDER — ONDANSETRON HCL 4 MG/2ML IJ SOLN
4.0000 mg | Freq: Once | INTRAMUSCULAR | Status: AC
  Administered 2017-11-24: 4 mg via INTRAVENOUS
  Filled 2017-11-24: qty 2

## 2017-11-24 MED ORDER — MORPHINE SULFATE (PF) 4 MG/ML IV SOLN
4.0000 mg | Freq: Once | INTRAVENOUS | Status: AC
  Administered 2017-11-24: 4 mg via INTRAVENOUS
  Filled 2017-11-24: qty 1

## 2017-11-24 MED ORDER — OXYCODONE-ACETAMINOPHEN 5-325 MG PO TABS: 1.0000 | ORAL_TABLET | ORAL | 0 refills | Status: AC | PRN
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
## 2017-11-24 NOTE — ED Notes (Signed)
Got patient on the monitor did ekg shown to Dr Particia Nearing patient is resting with nurse at bedside
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
## 2017-11-24 NOTE — ED Notes (Signed)
Family asking for update Dr. Hyacinth Meeker notified

## 2017-11-24 NOTE — ED Notes (Signed)
IV removed for splint placement

## 2017-11-24 NOTE — Discharge Instructions (Addendum)
Your testing today shows that you have some swollen lymph nodes in your chest, this needs to be rechecked within the next 3 to 6 months with a CT scan repeated.  You have a broken bone in your left hand, please see Dr. Izora Ribas or the orthopedic surgeon of your choice to have this reevaluated within the next 3 to 5 days.  You have a broken rib on the left side, please use the incentive spirometer every 30 minutes and keep your lungs well expanded.  Work on taking deep breaths.  Take Percocet as needed for severe pain, ibuprofen for mild pain, rest, take a stool softener to prevent constipation  If you should develop severe or worsening pain or difficulty breathing return to the emergency department immediately.

## 2017-11-24 NOTE — ED Notes (Signed)
Patient transported to X-ray 

## 2017-11-24 NOTE — ED Provider Notes (Signed)
Pt reexamined -as an isolated left anterior seventh rib fracture, pain medication given, incentive spirometry given, splint placed over the left hand for the fifth metacarpal fracture, he will need referral to orthopedics, he will also need to have repeat CT scanning of his chest given the lymphadenopathy in his chest, I shared these results with the patient and his family member and gave him a copy of the CT scan report which they report they will share with her doctor.  At this time the patient is stable for discharge, no other injuries including brain spine abdomen or pelvis.  No pneumothorax, no mediastinal abnormalities traumatically.   Eber Hong, MD 11/24/17 1816

## 2017-11-24 NOTE — ED Notes (Signed)
Patient transported to CT 
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
## 2017-11-24 NOTE — ED Notes (Signed)
Pt stable, ambulatory, states understanding of discharge instructions, wife at bedside. 

## 2017-11-24 NOTE — ED Notes (Signed)
Family at beside. Family given emotional support. 
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
## 2017-11-29 DIAGNOSIS — S62307A Unspecified fracture of fifth metacarpal bone, left hand, initial encounter for closed fracture: Secondary | ICD-10-CM | POA: Diagnosis not present

## 2017-12-05 ENCOUNTER — Encounter: Payer: Self-pay | Admitting: Vascular Surgery

## 2017-12-24 DIAGNOSIS — S62307D Unspecified fracture of fifth metacarpal bone, left hand, subsequent encounter for fracture with routine healing: Secondary | ICD-10-CM | POA: Diagnosis not present

## 2018-01-21 DIAGNOSIS — S62307D Unspecified fracture of fifth metacarpal bone, left hand, subsequent encounter for fracture with routine healing: Secondary | ICD-10-CM | POA: Diagnosis not present

## 2018-01-23 DIAGNOSIS — M79645 Pain in left finger(s): Secondary | ICD-10-CM | POA: Diagnosis not present

## 2018-01-23 DIAGNOSIS — M180 Bilateral primary osteoarthritis of first carpometacarpal joints: Secondary | ICD-10-CM | POA: Diagnosis not present

## 2018-02-25 DIAGNOSIS — M180 Bilateral primary osteoarthritis of first carpometacarpal joints: Secondary | ICD-10-CM | POA: Diagnosis not present

## 2018-10-14 DIAGNOSIS — E119 Type 2 diabetes mellitus without complications: Secondary | ICD-10-CM | POA: Diagnosis not present

## 2018-10-14 DIAGNOSIS — F17210 Nicotine dependence, cigarettes, uncomplicated: Secondary | ICD-10-CM | POA: Diagnosis not present

## 2018-10-14 DIAGNOSIS — J208 Acute bronchitis due to other specified organisms: Secondary | ICD-10-CM | POA: Diagnosis not present

## 2018-10-14 DIAGNOSIS — E7849 Other hyperlipidemia: Secondary | ICD-10-CM | POA: Diagnosis not present
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
